# Patient Record
Sex: Female | Born: 1990 | Race: Black or African American | Hispanic: No | Marital: Single | Smoking: Never smoker
Health system: Southern US, Community
[De-identification: ages and names within clinical notes are randomized; demographics above are authoritative.]

## PROBLEM LIST (undated history)

## (undated) ENCOUNTER — Inpatient Hospital Stay (HOSPITAL_COMMUNITY): Payer: Self-pay

## (undated) DIAGNOSIS — F419 Anxiety disorder, unspecified: Secondary | ICD-10-CM

## (undated) DIAGNOSIS — F410 Panic disorder [episodic paroxysmal anxiety] without agoraphobia: Secondary | ICD-10-CM

## (undated) DIAGNOSIS — G43909 Migraine, unspecified, not intractable, without status migrainosus: Secondary | ICD-10-CM

---
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
## 2011-01-28 ENCOUNTER — Emergency Department (HOSPITAL_COMMUNITY)
Admission: EM | Admit: 2011-01-28 | Discharge: 2011-01-28 | Disposition: A | Discharge status: Home / Self Care | Payer: Medicaid Other | Attending: Emergency Medicine | Admitting: Emergency Medicine

## 2011-01-28 DIAGNOSIS — L299 Pruritus, unspecified: Secondary | ICD-10-CM | POA: Insufficient documentation

## 2011-01-28 DIAGNOSIS — B86 Scabies: Secondary | ICD-10-CM | POA: Insufficient documentation

## 2011-05-22 ENCOUNTER — Emergency Department (HOSPITAL_COMMUNITY)
Admission: EM | Admit: 2011-05-22 | Discharge: 2011-05-22 | Disposition: A | Discharge status: Home / Self Care | Payer: Self-pay | Attending: Emergency Medicine | Admitting: Emergency Medicine

## 2011-05-22 DIAGNOSIS — R109 Unspecified abdominal pain: Secondary | ICD-10-CM | POA: Insufficient documentation

## 2011-05-22 DIAGNOSIS — R3915 Urgency of urination: Secondary | ICD-10-CM | POA: Insufficient documentation

## 2011-05-22 DIAGNOSIS — R300 Dysuria: Secondary | ICD-10-CM | POA: Insufficient documentation

## 2011-05-22 DIAGNOSIS — N390 Urinary tract infection, site not specified: Secondary | ICD-10-CM | POA: Insufficient documentation

## 2011-05-22 DIAGNOSIS — R350 Frequency of micturition: Secondary | ICD-10-CM | POA: Insufficient documentation

## 2011-05-22 DIAGNOSIS — R319 Hematuria, unspecified: Secondary | ICD-10-CM | POA: Insufficient documentation

## 2011-05-22 LAB — URINALYSIS, ROUTINE W REFLEX MICROSCOPIC
Bilirubin Urine: NEGATIVE
Nitrite: NEGATIVE
Protein, ur: 100 mg/dL — AB
Specific Gravity, Urine: 1.023 (ref 1.005–1.030)
Urobilinogen, UA: 1 mg/dL (ref 0.0–1.0)

## 2011-05-22 LAB — PREGNANCY, URINE: Preg Test, Ur: NEGATIVE

## 2011-05-25 ENCOUNTER — Emergency Department (HOSPITAL_COMMUNITY)
Admission: EM | Admit: 2011-05-25 | Discharge: 2011-05-25 | Disposition: A | Discharge status: Home / Self Care | Payer: Self-pay | Attending: Emergency Medicine | Admitting: Emergency Medicine

## 2011-05-25 DIAGNOSIS — N390 Urinary tract infection, site not specified: Secondary | ICD-10-CM | POA: Insufficient documentation

## 2011-05-25 DIAGNOSIS — R300 Dysuria: Secondary | ICD-10-CM | POA: Insufficient documentation

## 2011-05-25 DIAGNOSIS — R3915 Urgency of urination: Secondary | ICD-10-CM | POA: Insufficient documentation

## 2011-05-25 DIAGNOSIS — R109 Unspecified abdominal pain: Secondary | ICD-10-CM | POA: Insufficient documentation

## 2011-05-25 LAB — URINALYSIS, ROUTINE W REFLEX MICROSCOPIC
Glucose, UA: NEGATIVE mg/dL
Nitrite: NEGATIVE
Specific Gravity, Urine: 1.027 (ref 1.005–1.030)
pH: 6 (ref 5.0–8.0)

## 2011-05-25 LAB — URINE MICROSCOPIC-ADD ON

## 2011-05-27 LAB — URINE CULTURE

## 2012-05-29 ENCOUNTER — Encounter (HOSPITAL_COMMUNITY): Payer: Self-pay | Admitting: *Deleted

## 2012-05-29 ENCOUNTER — Emergency Department (HOSPITAL_COMMUNITY)
Admission: EM | Admit: 2012-05-29 | Discharge: 2012-05-29 | Disposition: A | Discharge status: Home / Self Care | Payer: BC Managed Care – HMO | Attending: Emergency Medicine | Admitting: Emergency Medicine

## 2012-05-29 DIAGNOSIS — Z881 Allergy status to other antibiotic agents status: Secondary | ICD-10-CM | POA: Insufficient documentation

## 2012-05-29 DIAGNOSIS — N390 Urinary tract infection, site not specified: Secondary | ICD-10-CM | POA: Insufficient documentation

## 2012-05-29 DIAGNOSIS — F411 Generalized anxiety disorder: Secondary | ICD-10-CM | POA: Insufficient documentation

## 2012-05-29 HISTORY — DX: Anxiety disorder, unspecified: F41.9

## 2012-05-29 LAB — URINALYSIS, ROUTINE W REFLEX MICROSCOPIC
Ketones, ur: NEGATIVE mg/dL
Nitrite: POSITIVE — AB
Specific Gravity, Urine: 1.019 (ref 1.005–1.030)
pH: 6 (ref 5.0–8.0)

## 2012-05-29 LAB — URINE MICROSCOPIC-ADD ON

## 2012-05-29 MED ORDER — CIPROFLOXACIN HCL 500 MG PO TABS: 250.0000 mg | ORAL_TABLET | Freq: Two times a day (BID) | ORAL | Status: DC

## 2012-05-29 MED ORDER — PHENAZOPYRIDINE HCL 100 MG PO TABS
100.0000 mg | ORAL_TABLET | Freq: Once | ORAL | Status: AC
  Administered 2012-05-29: 100 mg via ORAL

## 2012-05-29 MED ORDER — PHENAZOPYRIDINE HCL 200 MG PO TABS: 200.0000 mg | ORAL_TABLET | Freq: Three times a day (TID) | ORAL | Status: DC

## 2012-05-29 MED ORDER — CIPROFLOXACIN HCL 500 MG PO TABS
500.0000 mg | ORAL_TABLET | Freq: Once | ORAL | Status: AC
  Administered 2012-05-29: 500 mg via ORAL
  Filled 2012-05-29: qty 1

## 2012-05-29 NOTE — ED Notes (Signed)
Pt is here for evaluation of what pt feels is a UTI.  She reports pain with urination beginning yesterday and blood in her urine beginning today.  Pt has had same before

## 2012-05-29 NOTE — ED Provider Notes (Signed)
History     CSN: [id number]  Arrival date & time 05/29/12  4098   First MD Initiated Contact with Patient 05/29/12 2109      Chief Complaint  Patient presents with  . Urinary Tract Infection    (Consider location/radiation/quality/duration/timing/severity/associated sxs/prior treatment) HPI Comments: Patient is a healthy 21 year old female with a 2 day history of dysuria and hematuria. She reports progressive onset of her symptoms 2 days ago. The symptoms occur every time she urinates. She reports this same feeling when she had a UTI 1 year ago. She did not try anything for symptom relief and denies alleviating factors. She denies fever, NVD, back pain, abdominal pain, vaginal bleeding, menstrual abnormalities.    Past Medical History  Diagnosis Date  . Anxiety     History reviewed. No pertinent past surgical history.  No family history on file.  History  Substance Use Topics  . Smoking status: Not on file  . Smokeless tobacco: Not on file  . Alcohol Use: Yes    OB History    Grav Para Term Preterm Abortions TAB SAB Ect Mult Living                  Review of Systems  Genitourinary: Positive for dysuria and hematuria.  All other systems reviewed and are negative.    Allergies  Amoxicillin  Home Medications   Current Outpatient Rx  Name Route Sig Dispense Refill  . IBUPROFEN 200 MG PO TABS Oral Take 800 mg by mouth every 6 (six) hours as needed. For pain    . CIPROFLOXACIN HCL 500 MG PO TABS Oral Take 0.5 tablets (250 mg total) by mouth 2 (two) times daily. 6 tablet 0  . PHENAZOPYRIDINE HCL 200 MG PO TABS Oral Take 1 tablet (200 mg total) by mouth 3 (three) times daily. 6 tablet 0    BP 114/71  Pulse 66  Temp 97.8 F (36.6 C) (Oral)  Resp 16  SpO2 99%  LMP 05/15/2012  Physical Exam  Nursing note and vitals reviewed. Constitutional: She is oriented to person, place, and time. She appears well-developed and well-nourished. No distress.  HENT:  Head:  Normocephalic and atraumatic.  Eyes: Conjunctivae normal and EOM are normal. No scleral icterus.  Neck: Normal range of motion. Neck supple.  Cardiovascular: Normal rate and regular rhythm.  Exam reveals no gallop and no friction rub.   No murmur heard. Pulmonary/Chest: Effort normal and breath sounds normal. She has no wheezes. She has no rales. She exhibits no tenderness.  Abdominal: Soft. There is no tenderness.       No suprapubic tenderness to palpation.   Musculoskeletal: Normal range of motion.  Neurological: She is alert and oriented to person, place, and time. Coordination normal.       Speech is goal-oriented. Moves limbs without ataxia.   Skin: Skin is warm and dry.  Psychiatric: She has a normal mood and affect. Her behavior is normal.    ED Course  Procedures (including critical care time)  Labs Reviewed  URINALYSIS, ROUTINE W REFLEX MICROSCOPIC - Abnormal; Notable for the following:    APPearance CLOUDY (*)     Hgb urine dipstick LARGE (*)     Protein, ur 30 (*)     Nitrite POSITIVE (*)     Leukocytes, UA LARGE (*)     All other components within normal limits  URINE MICROSCOPIC-ADD ON - Abnormal; Notable for the following:    Squamous Epithelial / LPF FEW (*)  Bacteria, UA MANY (*)     All other components within normal limits  POCT PREGNANCY, URINE   No results found.   1. UTI (urinary tract infection)       MDM  9:31 PM Patient's urinalysis shows a UTI. I will treat her with Cipro rather than bactrim because Bactrim has not worked for her with previous UTIs. I will also prescribe her pyridium for her dysuria. She is afebrile with no other complaints. No further evaluation needed at this time.         Emilia Beck, PA-C 05/29/12 2142

## 2012-05-29 NOTE — ED Provider Notes (Signed)
Medical screening examination/treatment/procedure(s) were performed by non-physician practitioner and as supervising physician I was immediately available for consultation/collaboration.   Charles B. Sheldon, MD 05/29/12 2246 

## 2012-06-01 LAB — URINE CULTURE: Colony Count: >=100000

## 2012-06-02 NOTE — ED Notes (Signed)
+  Urine. Patient treated with Cipro. Sensitive to same. Per protocol MD. °

## 2012-10-17 ENCOUNTER — Emergency Department (HOSPITAL_COMMUNITY): Payer: BC Managed Care – PPO

## 2012-10-17 ENCOUNTER — Encounter (HOSPITAL_COMMUNITY): Payer: Self-pay | Admitting: Emergency Medicine

## 2012-10-17 ENCOUNTER — Emergency Department (HOSPITAL_COMMUNITY)
Admission: EM | Admit: 2012-10-17 | Discharge: 2012-10-18 | Disposition: A | Discharge status: Home / Self Care | Payer: BC Managed Care – PPO | Attending: Emergency Medicine | Admitting: Emergency Medicine

## 2012-10-17 DIAGNOSIS — R042 Hemoptysis: Secondary | ICD-10-CM | POA: Insufficient documentation

## 2012-10-17 DIAGNOSIS — F411 Generalized anxiety disorder: Secondary | ICD-10-CM | POA: Insufficient documentation

## 2012-10-17 DIAGNOSIS — Z79899 Other long term (current) drug therapy: Secondary | ICD-10-CM | POA: Insufficient documentation

## 2012-10-17 DIAGNOSIS — R059 Cough, unspecified: Secondary | ICD-10-CM | POA: Insufficient documentation

## 2012-10-17 DIAGNOSIS — J3489 Other specified disorders of nose and nasal sinuses: Secondary | ICD-10-CM | POA: Insufficient documentation

## 2012-10-17 HISTORY — DX: Panic disorder (episodic paroxysmal anxiety): F41.0

## 2012-10-17 LAB — POCT I-STAT, CHEM 8
Calcium, Ion: 1.26 mmol/L — ABNORMAL HIGH (ref 1.12–1.23)
Glucose, Bld: 89 mg/dL (ref 70–99)
HCT: 40 % (ref 36.0–46.0)
Hemoglobin: 13.6 g/dL (ref 12.0–15.0)
Potassium: 4.2 mEq/L (ref 3.5–5.1)

## 2012-10-17 NOTE — ED Notes (Signed)
Patient complaining of being sick since the end of December; has been treating herself with Mucinex and other OTC medications; patient reports that she was coughing up blood about 20 minutes ago; patient also complaining of dizziness, and a productive cough.  Cough sounds congested.

## 2012-10-18 MED ORDER — BENZONATATE 100 MG PO CAPS: 100.0000 mg | ORAL_CAPSULE | Freq: Three times a day (TID) | ORAL | Status: DC

## 2012-10-18 MED ORDER — AZITHROMYCIN 250 MG PO TABS: 250.0000 mg | ORAL_TABLET | Freq: Every day | ORAL | Status: DC

## 2012-10-18 NOTE — ED Notes (Signed)
The patient is AOx4 and comfortable with her discharge instructions. 

## 2012-10-18 NOTE — ED Provider Notes (Signed)
History     CSN: [id number]  Arrival date & time 10/17/12  2014   First MD Initiated Contact with Patient 10/18/12 0004      Chief Complaint  Patient presents with  . Cough  . Emesis    (Consider location/radiation/quality/duration/timing/severity/associated sxs/prior treatment) HPI History provided by patient. Past medical history of anxiety and panic. Has been sick on off for last few months with a persistent dry cough.  Recently she has had some associated congestion. Tonight coughing and noticed some specks of what she thought was blood. The patient very worried and presents here for evaluation. No difficulty breathing. No history of DVT or PE. Is not on birth control.  No leg pain or leg swelling. Works at [employer] and has multiple sick contacts with similar symptoms. Hemoptysis mild in severity. No chest pain. No fevers. No hemoptysis in the emergency department.  Past Medical History  Diagnosis Date  . Anxiety   . Panic attack     History reviewed. No pertinent past surgical history.  History reviewed. No pertinent family history.  History  Substance Use Topics  . Smoking status: Never Smoker   . Smokeless tobacco: Not on file  . Alcohol Use: Yes    OB History   Grav Para Term Preterm Abortions TAB SAB Ect Mult Living                  Review of Systems  Constitutional: Negative for fever and chills.  HENT: Positive for congestion. Negative for neck pain and neck stiffness.   Eyes: Negative for pain.  Respiratory: Positive for cough. Negative for shortness of breath.   Cardiovascular: Negative for chest pain.  Gastrointestinal: Negative for vomiting and abdominal pain.  Genitourinary: Negative for dysuria.  Musculoskeletal: Negative for back pain.  Skin: Negative for rash.  Neurological: Negative for headaches.  All other systems reviewed and are negative.    Allergies  Amoxicillin  Home Medications   Current Outpatient Rx  Name  Route  Sig   Dispense  Refill  . azithromycin (ZITHROMAX Z-PAK) 250 MG tablet   Oral   Take 1 tablet (250 mg total) by mouth daily.   6 tablet   0   . benzonatate (TESSALON) 100 MG capsule   Oral   Take 1 capsule (100 mg total) by mouth every 8 (eight) hours.   21 capsule   0   . ciprofloxacin (CIPRO) 500 MG tablet   Oral   Take 0.5 tablets (250 mg total) by mouth 2 (two) times daily.   6 tablet   0   . ibuprofen (ADVIL,MOTRIN) 200 MG tablet   Oral   Take 800 mg by mouth every 6 (six) hours as needed. For pain         . phenazopyridine (PYRIDIUM) 200 MG tablet   Oral   Take 1 tablet (200 mg total) by mouth 3 (three) times daily.   6 tablet   0     BP 101/50  Pulse 86  Temp(Src) 98.2 F (36.8 C) (Oral)  Resp 18  SpO2 98%  LMP 10/17/2012  Physical Exam  Constitutional: She is oriented to person, place, and time. She appears well-developed and well-nourished.  HENT:  Head: Normocephalic and atraumatic.  Mouth/Throat: Oropharynx is clear and moist. No oropharyngeal exudate.  Nasal congestion. No tenderness over sinuses  Eyes: Conjunctivae and EOM are normal. Pupils are equal, round, and reactive to light.  Neck: Normal range of motion. Neck supple.  Cardiovascular:  Normal rate, regular rhythm and intact distal pulses.   Pulmonary/Chest: Effort normal and breath sounds normal. No stridor. No respiratory distress. She exhibits no tenderness.  Abdominal: Soft. Bowel sounds are normal. She exhibits no distension. There is no tenderness.  Musculoskeletal: Normal range of motion. She exhibits no edema.  Lymphadenopathy:    She has no cervical adenopathy.  Neurological: She is alert and oriented to person, place, and time.  Skin: Skin is warm and dry.    ED Course  Procedures (including critical care time)  Labs Reviewed  POCT I-STAT, CHEM 8 - Abnormal; Notable for the following:    Calcium, Ion 1.26 (*)    All other components within normal limits   Dg Chest 2  View  10/17/2012  *RADIOLOGY REPORT*  Clinical Data: Coughing up blood.  The patient  has been sick since December.  CHEST - 2 VIEW  Comparison: None.  Findings: The heart size and pulmonary vascularity are normal. The lungs appear clear and expanded without focal air space disease or consolidation. No blunting of the costophrenic angles.  No pneumothorax.  Mediastinal contours appear intact.  IMPRESSION: No evidence of active pulmonary disease.   Original Report Authenticated By: Burman Nieves, M.D.      1. Cough with hemoptysis       MDM   Congestion and persistent dry cough - likely sequela of viral URI.  Mild hemoptysis today. No indication for PE workup at this time given mild symptoms resolved, vital signs within normal limits and PERC negative.  Chest x-ray labs reviewed as above  Prescription for cough medication a Z-Pak provided.   Hemoptysis precautions verbalized as understood.   Vital signs nursing notes reviewed        Sunnie Nielsen, MD 10/18/12 [phone]

## 2013-03-15 ENCOUNTER — Emergency Department (HOSPITAL_COMMUNITY)
Admission: EM | Admit: 2013-03-15 | Discharge: 2013-03-15 | Disposition: A | Discharge status: Home / Self Care | Payer: BC Managed Care – PPO | Attending: Emergency Medicine | Admitting: Emergency Medicine

## 2013-03-15 ENCOUNTER — Emergency Department (HOSPITAL_COMMUNITY): Payer: BC Managed Care – PPO

## 2013-03-15 ENCOUNTER — Encounter (HOSPITAL_COMMUNITY): Payer: Self-pay | Admitting: Adult Health

## 2013-03-15 DIAGNOSIS — F411 Generalized anxiety disorder: Secondary | ICD-10-CM | POA: Insufficient documentation

## 2013-03-15 DIAGNOSIS — R042 Hemoptysis: Secondary | ICD-10-CM | POA: Insufficient documentation

## 2013-03-15 DIAGNOSIS — R071 Chest pain on breathing: Secondary | ICD-10-CM | POA: Insufficient documentation

## 2013-03-15 DIAGNOSIS — R05 Cough: Secondary | ICD-10-CM | POA: Insufficient documentation

## 2013-03-15 DIAGNOSIS — R059 Cough, unspecified: Secondary | ICD-10-CM | POA: Insufficient documentation

## 2013-03-15 LAB — CBC WITH DIFFERENTIAL/PLATELET
Eosinophils Relative: 3 % (ref 0–5)
HCT: 34.4 % — ABNORMAL LOW (ref 36.0–46.0)
Hemoglobin: 11.4 g/dL — ABNORMAL LOW (ref 12.0–15.0)
Lymphocytes Relative: 39 % (ref 12–46)
Lymphs Abs: 2.1 10*3/uL (ref 0.7–4.0)
MCV: 85.6 fL (ref 78.0–100.0)
Monocytes Absolute: 0.3 10*3/uL (ref 0.1–1.0)
RBC: 4.02 MIL/uL (ref 3.87–5.11)
WBC: 5.3 10*3/uL (ref 4.0–10.5)

## 2013-03-15 NOTE — ED Provider Notes (Signed)
CSN: [id number]     Arrival date & time 03/15/13  0241 History     First MD Initiated Contact with Patient 03/15/13 0320     Chief Complaint  Patient presents with  . Hemoptysis   (Consider location/radiation/quality/duration/timing/severity/associated sxs/prior Treatment) HPI 22 yo female presents to the ER from home with c/o 2 episodes of coughing blood.  First episode productive of "chunk" of tsp of blood, second episode blood streaked sputum.  No recent URI, sinus infection, nose bleed.  No further coughing.  Some central dull chest pain.  No fevers, chills.  No exposure to TB, no night sweats, no significant weight loss.  Pt had similar episode about 5 months ago, but that one had more coughing/URI sxs.  No sob.  No exogenous hormones, no leg swelling.  Pt works at [employer].  No h/o PE, blood disorders.  Pt smokes one "black and tan" a day  Past Medical History  Diagnosis Date  . Anxiety   . Panic attack    History reviewed. No pertinent past surgical history. History reviewed. No pertinent family history. History  Substance Use Topics  . Smoking status: Never Smoker   . Smokeless tobacco: Not on file  . Alcohol Use: Yes   OB History   Grav Para Term Preterm Abortions TAB SAB Ect Mult Living                 Review of Systems  All other systems reviewed and are negative.    Allergies  Amoxicillin  Home Medications   Current Outpatient Rx  Name  Route  Sig  Dispense  Refill  . ibuprofen (ADVIL,MOTRIN) 800 MG tablet   Oral   Take 800 mg by mouth every 8 (eight) hours as needed for pain.          BP 112/68  Pulse 58  Temp(Src) 98.4 F (36.9 C) (Oral)  Resp 16  SpO2 100%  LMP 03/15/2013 Physical Exam  Nursing note and vitals reviewed. Constitutional: She is oriented to person, place, and time. She appears well-developed and well-nourished.  HENT:  Head: Normocephalic and atraumatic.  Right Ear: External ear normal.  Left Ear: External ear  normal.  Nose: Nose normal.  Mouth/Throat: Oropharynx is clear and moist.  Eyes: Conjunctivae and EOM are normal. Pupils are equal, round, and reactive to light.  Neck: Normal range of motion. Neck supple. No JVD present. No tracheal deviation present. No thyromegaly present.  Cardiovascular: Normal rate, regular rhythm, normal heart sounds and intact distal pulses.  Exam reveals no gallop and no friction rub.   No murmur heard. Pulmonary/Chest: Effort normal and breath sounds normal. No stridor. No respiratory distress. She has no wheezes. She has no rales. She exhibits no tenderness.  Abdominal: Soft. Bowel sounds are normal. She exhibits no distension and no mass. There is no tenderness. There is no rebound and no guarding.  Musculoskeletal: Normal range of motion. She exhibits no edema and no tenderness.  Lymphadenopathy:    She has no cervical adenopathy.  Neurological: She is alert and oriented to person, place, and time. She exhibits normal muscle tone. Coordination normal.  Skin: Skin is warm and dry. No rash noted. No erythema. No pallor.  Psychiatric: She has a normal mood and affect. Her behavior is normal. Judgment and thought content normal.    ED Course   Procedures (including critical care time)  Labs Reviewed  CBC WITH DIFFERENTIAL - Abnormal; Notable for the following:  Hemoglobin 11.4 (*)    HCT 34.4 (*)    All other components within normal limits  QUANTIFERON TB GOLD ASSAY   Dg Chest 2 View  03/15/2013   *RADIOLOGY REPORT*  Clinical Data: Chest pain  CHEST - 2 VIEW  Comparison: Prior radiograph from 10/17/2012  Findings: The cardiac and mediastinal silhouettes are stable in size and contour, and remain within normal limits.  The lungs are well inflated.  No airspace consolidation, pleural effusion, or pulmonary edema is identified.  There is no pneumothorax.  Osseous structures are within normal limits.  Partial fusion of the right fourth and fifth ribs is noted,  unchanged.  IMPRESSION:  No active cardiopulmonary disease.   Original Report Authenticated By: Rise Mu, M.D.   1. Cough with hemoptysis     MDM  22 yo female with hemoptysis.  Normal cxr, normal exam.  No further coughing.  Will get quantiferon assay to be followed up outpatient.  Will refer to local [facility] and pulmonology.  Olivia Mackie, MD 03/15/13 [phone]

## 2013-03-15 NOTE — ED Notes (Addendum)
Presents with cough and blood tinged sputum that began this evening associated with SOB and chest pain. Pain is with cough.  Seen here one month ago for same.

## 2013-04-30 ENCOUNTER — Encounter (HOSPITAL_COMMUNITY): Payer: Self-pay | Admitting: Emergency Medicine

## 2013-04-30 ENCOUNTER — Emergency Department (HOSPITAL_COMMUNITY)
Admission: EM | Admit: 2013-04-30 | Discharge: 2013-04-30 | Disposition: A | Discharge status: Home / Self Care | Payer: Self-pay | Source: Home / Self Care

## 2013-04-30 DIAGNOSIS — J029 Acute pharyngitis, unspecified: Secondary | ICD-10-CM

## 2013-04-30 MED ORDER — AZITHROMYCIN 250 MG PO TABS: ORAL_TABLET | ORAL | Status: DC

## 2013-04-30 NOTE — ED Notes (Signed)
Pt c/o sore throat onset 2 days Sxs include: odynophagia, chills, BA, fevers, Denies: cough, runny nose, congestion Alert w/no signs of acute distress.

## 2013-04-30 NOTE — ED Provider Notes (Signed)
CSN: [id number]     Arrival date & time 04/30/13  1037 History   First MD Initiated Contact with Patient 04/30/13 1058     Chief Complaint  Patient presents with  . Sore Throat   (Consider location/radiation/quality/duration/timing/severity/associated sxs/prior Treatment) HPI Comments: Sore throat for 2 d. Odynophagia, bodyaches, pain with opening jaw. Reports temp 102 deg last PM   Past Medical History  Diagnosis Date  . Anxiety   . Panic attack    History reviewed. No pertinent past surgical history. No family history on file. History  Substance Use Topics  . Smoking status: Never Smoker   . Smokeless tobacco: Not on file  . Alcohol Use: Yes   OB History   Grav Para Term Preterm Abortions TAB SAB Ect Mult Living                 Review of Systems  Constitutional: Positive for fever, activity change and fatigue.  HENT: Positive for sore throat. Negative for ear pain, congestion, facial swelling, rhinorrhea, neck pain, neck stiffness, postnasal drip, sinus pressure and ear discharge.   Respiratory: Negative for cough and shortness of breath.   Cardiovascular: Negative.   Gastrointestinal: Negative.   Genitourinary: Negative.   Musculoskeletal: Positive for myalgias.  Neurological: Negative.     Allergies  Amoxicillin  Home Medications   Current Outpatient Rx  Name  Route  Sig  Dispense  Refill  . azithromycin (ZITHROMAX) 250 MG tablet      2 tabs daily for 5 days.   10 tablet   0   . ibuprofen (ADVIL,MOTRIN) 800 MG tablet   Oral   Take 800 mg by mouth every 8 (eight) hours as needed for pain.          BP 98/60  Pulse 83  Temp(Src) 98.4 F (36.9 C) (Oral)  Resp 16  SpO2 98%  LMP 04/09/2013 Physical Exam  Nursing note and vitals reviewed. Constitutional: She is oriented to person, place, and time. She appears well-developed and well-nourished. No distress.  HENT:  Bilat TM's WNL OP with erythema, mild swelling and tonsilar exudates. No sign of  retropharnygeal  Eyes: Conjunctivae and EOM are normal. Pupils are equal, round, and reactive to light.  Neck: Normal range of motion. Neck supple.  Cardiovascular: Normal rate, regular rhythm and normal heart sounds.   Pulmonary/Chest: Effort normal and breath sounds normal. No respiratory distress. She has no wheezes.  Musculoskeletal: Normal range of motion.  Lymphadenopathy:    She has no cervical adenopathy.  Neurological: She is alert and oriented to person, place, and time. No cranial nerve deficit. She exhibits normal muscle tone.  Skin: Skin is warm and dry. No rash noted. No erythema.  Psychiatric: She has a normal mood and affect.    ED Course  Procedures (including critical care time) Labs Review Labs Reviewed  POCT RAPID STREP A ([facility] ONLY)  POCT RAPID STREP A ([facility] ONLY)   Results for orders placed during the hospital encounter of 04/30/13  POCT RAPID STREP A ([facility] ONLY)      Result Value Range   Streptococcus, Group A Screen (Direct) NEGATIVE  NEGATIVE  POCT RAPID STREP A ([facility] ONLY)      Result Value Range   Streptococcus, Group A Screen (Direct) NEGATIVE  NEGATIVE    Imaging Review No results found.  MDM   1. Exudative pharyngitis      Rapid strep test is negative. However, I  am inclined to treat her with antibiotics due to the tonsillar swelling, pharyngeal erythema and exudates. She has also had a reported temporal 102 at home she is having bodyaches fatigue and malaise. She is allergic to penicillin. Azithromycin 500 mg daily for 5 days. Rest, plenty of fluids, ibuprofen 600 mg every 6 hours with food when necessary pain. Chloraseptic Spray when necessary; Cepacol lozenges needed for sore throat discomfort.     Hayden Rasmussen, NP 04/30/13 1126

## 2013-04-30 NOTE — ED Provider Notes (Signed)
Medical screening examination/treatment/procedure(s) were performed by non-physician practitioner and as supervising physician I was immediately available for consultation/collaboration.  Leslee Home, M.D.   Reuben Likes, MD 04/30/13 1535

## 2013-05-02 LAB — CULTURE, GROUP A STREP

## 2015-04-13 ENCOUNTER — Encounter (HOSPITAL_COMMUNITY): Payer: Self-pay | Admitting: Emergency Medicine

## 2015-04-13 ENCOUNTER — Emergency Department (HOSPITAL_COMMUNITY)
Admission: EM | Admit: 2015-04-13 | Discharge: 2015-04-13 | Disposition: A | Discharge status: Home / Self Care | Payer: Self-pay | Attending: Emergency Medicine | Admitting: Emergency Medicine

## 2015-04-13 DIAGNOSIS — Z792 Long term (current) use of antibiotics: Secondary | ICD-10-CM | POA: Insufficient documentation

## 2015-04-13 DIAGNOSIS — S3992XA Unspecified injury of lower back, initial encounter: Secondary | ICD-10-CM | POA: Insufficient documentation

## 2015-04-13 DIAGNOSIS — Y92410 Unspecified street and highway as the place of occurrence of the external cause: Secondary | ICD-10-CM | POA: Insufficient documentation

## 2015-04-13 DIAGNOSIS — M545 Low back pain, unspecified: Secondary | ICD-10-CM

## 2015-04-13 DIAGNOSIS — Z8659 Personal history of other mental and behavioral disorders: Secondary | ICD-10-CM | POA: Insufficient documentation

## 2015-04-13 DIAGNOSIS — Z880 Allergy status to penicillin: Secondary | ICD-10-CM | POA: Insufficient documentation

## 2015-04-13 DIAGNOSIS — Y9389 Activity, other specified: Secondary | ICD-10-CM | POA: Insufficient documentation

## 2015-04-13 DIAGNOSIS — Y998 Other external cause status: Secondary | ICD-10-CM | POA: Insufficient documentation

## 2015-04-13 MED ORDER — KETOROLAC TROMETHAMINE 60 MG/2ML IM SOLN
60.0000 mg | Freq: Once | INTRAMUSCULAR | Status: AC
  Administered 2015-04-13: 60 mg via INTRAMUSCULAR
  Filled 2015-04-13: qty 2

## 2015-04-13 MED ORDER — METHOCARBAMOL 500 MG PO TABS: 500.0000 mg | ORAL_TABLET | Freq: Two times a day (BID) | ORAL | Status: DC

## 2015-04-13 MED ORDER — NAPROXEN 500 MG PO TABS: 500.0000 mg | ORAL_TABLET | Freq: Two times a day (BID) | ORAL | Status: DC
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
## 2015-04-13 NOTE — ED Notes (Signed)
Declined W/C at D/C and was escorted to lobby by RN. 

## 2016-03-28 ENCOUNTER — Encounter (HOSPITAL_COMMUNITY): Payer: Self-pay | Admitting: Emergency Medicine

## 2016-03-28 ENCOUNTER — Emergency Department (HOSPITAL_COMMUNITY)
Admission: EM | Admit: 2016-03-28 | Discharge: 2016-03-28 | Disposition: A | Discharge status: Home / Self Care | Payer: Self-pay | Attending: Emergency Medicine | Admitting: Emergency Medicine

## 2016-03-28 DIAGNOSIS — N760 Acute vaginitis: Secondary | ICD-10-CM | POA: Insufficient documentation

## 2016-03-28 DIAGNOSIS — B9689 Other specified bacterial agents as the cause of diseases classified elsewhere: Secondary | ICD-10-CM | POA: Insufficient documentation

## 2016-03-28 DIAGNOSIS — N390 Urinary tract infection, site not specified: Secondary | ICD-10-CM | POA: Insufficient documentation

## 2016-03-28 LAB — URINALYSIS, ROUTINE W REFLEX MICROSCOPIC
GLUCOSE, UA: NEGATIVE mg/dL
HGB URINE DIPSTICK: NEGATIVE
KETONES UR: 15 mg/dL — AB
NITRITE: POSITIVE — AB
PH: 6 (ref 5.0–8.0)
Protein, ur: NEGATIVE mg/dL
SPECIFIC GRAVITY, URINE: 1.03 (ref 1.005–1.030)

## 2016-03-28 LAB — URINE MICROSCOPIC-ADD ON

## 2016-03-28 LAB — WET PREP, GENITAL
SPERM: NONE SEEN
Trich, Wet Prep: NONE SEEN
Yeast Wet Prep HPF POC: NONE SEEN

## 2016-03-28 LAB — I-STAT BETA HCG BLOOD, ED (MC, WL, AP ONLY)

## 2016-03-28 MED ORDER — CIPROFLOXACIN HCL 500 MG PO TABS: 500.0000 mg | ORAL_TABLET | Freq: Two times a day (BID) | ORAL | 0 refills | Status: DC

## 2016-03-28 MED ORDER — METRONIDAZOLE 500 MG PO TABS: 500.0000 mg | ORAL_TABLET | Freq: Two times a day (BID) | ORAL | 0 refills | Status: DC

## 2016-03-28 NOTE — ED Notes (Signed)
Pelvic cart at bedside. 

## 2016-03-28 NOTE — ED Provider Notes (Signed)
[facility] DEPT Provider Note   CSN: [id number] Arrival date & time: 03/28/16  [id number]  First Provider Contact:  First MD Initiated Contact with Patient 03/28/16 [phone]        History   Chief Complaint Chief Complaint  Patient presents with  . Vaginal Discharge  . Dysuria    HPI April Riddle is a 25 y.o. female.  Patient presents with dysuria. She's had 3 day history of urinary frequency and burning on urination. She has some discomfort in her suprapubic area. No nausea or vomiting. No known fevers. She has been feeling fatigued. She also has developed some sores in her vaginal area and has some vaginal discharge. She is sexually active. She has taken Azo over-the-counter without improvement in symptoms.      Past Medical History:  Diagnosis Date  . Anxiety   . Panic attack     Patient Active Problem List   Diagnosis Date Noted  . Panic attack     History reviewed. No pertinent surgical history.  OB History    Gravida Para Term Preterm AB Living   0 0 0 0 0 0   SAB TAB Ectopic Multiple Live Births   0 0 0 0 0       Home Medications    Prior to Admission medications   Medication Sig Start Date End Date Taking? Authorizing Provider  acetaminophen (TYLENOL) 500 MG tablet Take 1,000 mg by mouth every 6 (six) hours as needed for moderate pain or headache.   Yes Historical Provider, MD  phenazopyridine (AZO-STANDARD) 95 MG tablet Take 95 mg by mouth 3 (three) times daily as needed for pain.   Yes Historical Provider, MD  azithromycin (ZITHROMAX) 250 MG tablet 2 tabs daily for 5 days. Patient not taking: Reported on 03/28/2016 04/30/13   Janne Napoleon, NP  ciprofloxacin (CIPRO) 500 MG tablet Take 1 tablet (500 mg total) by mouth 2 (two) times daily. One po bid x 7 days 03/28/16   Malvin Johns, MD  ibuprofen (ADVIL,MOTRIN) 800 MG tablet Take 800 mg by mouth every 8 (eight) hours as needed for pain.    Historical Provider, MD  methocarbamol (ROBAXIN) 500 MG tablet Take 1  tablet (500 mg total) by mouth 2 (two) times daily. Patient not taking: Reported on 03/28/2016 04/13/15   Comer Locket, PA-C  metroNIDAZOLE (FLAGYL) 500 MG tablet Take 1 tablet (500 mg total) by mouth 2 (two) times daily. One po bid x 7 days 03/28/16   Malvin Johns, MD  naproxen (NAPROSYN) 500 MG tablet Take 1 tablet (500 mg total) by mouth 2 (two) times daily. Patient not taking: Reported on 03/28/2016 04/13/15   Comer Locket, PA-C    Family History No family history on file.  Social History Social History  Substance Use Topics  . Smoking status: Never Smoker  . Smokeless tobacco: Never Used  . Alcohol use No     Allergies   Amoxicillin   Review of Systems Review of Systems  Constitutional: Negative for chills, diaphoresis, fatigue and fever.  HENT: Negative for congestion, rhinorrhea and sneezing.   Eyes: Negative.   Respiratory: Negative for cough, chest tightness and shortness of breath.   Cardiovascular: Negative for chest pain and leg swelling.  Gastrointestinal: Positive for abdominal pain. Negative for blood in stool, diarrhea, nausea and vomiting.  Genitourinary: Positive for dysuria, frequency, hematuria and vaginal discharge. Negative for difficulty urinating, flank pain, vaginal bleeding and vaginal pain.  Musculoskeletal: Negative for arthralgias and back pain.  Skin:  Negative for rash.  Neurological: Negative for dizziness, speech difficulty, weakness, numbness and headaches.     Physical Exam Updated Vital Signs BP 104/66 (BP Location: Right Arm)   Pulse 74   Temp 98.4 F (36.9 C) (Oral)   Resp 15   Ht 5\' 8"  (1.727 m)   Wt 155 lb (70.3 kg)   LMP 03/17/2016 (Approximate)   SpO2 100%   BMI 23.57 kg/m   Physical Exam  Constitutional: She is oriented to person, place, and time. She appears well-developed and well-nourished.  HENT:  Head: Normocephalic and atraumatic.  Eyes: Pupils are equal, round, and reactive to light.  Neck: Normal range of  motion. Neck supple.  Cardiovascular: Normal rate, regular rhythm and normal heart sounds.   Pulmonary/Chest: Effort normal and breath sounds normal. No respiratory distress. She has no wheezes. She has no rales. She exhibits no tenderness.  Abdominal: Soft. Bowel sounds are normal. There is tenderness (Mild suprapubic tenderness). There is no rebound and no guarding.  Genitourinary:  Genitourinary Comments: Patient has large amount of white yellow discharge. No significant cervical motion tenderness or adnexal tenderness.  No blisters or sores.  Musculoskeletal: Normal range of motion. She exhibits no edema.  Lymphadenopathy:    She has no cervical adenopathy.  Neurological: She is alert and oriented to person, place, and time.  Skin: Skin is warm and dry. No rash noted.  Psychiatric: She has a normal mood and affect.     ED Treatments / Results  Labs (all labs ordered are listed, but only abnormal results are displayed) Labs Reviewed  WET PREP, GENITAL - Abnormal; Notable for the following:       Result Value   Clue Cells Wet Prep HPF POC PRESENT (*)    WBC, Wet Prep HPF POC MANY (*)    All other components within normal limits  URINALYSIS, ROUTINE W REFLEX MICROSCOPIC (NOT AT [facility]) - Abnormal; Notable for the following:    Color, Urine ORANGE (*)    APPearance CLOUDY (*)    Bilirubin Urine SMALL (*)    Ketones, ur 15 (*)    Nitrite POSITIVE (*)    Leukocytes, UA SMALL (*)    All other components within normal limits  URINE MICROSCOPIC-ADD ON - Abnormal; Notable for the following:    Squamous Epithelial / LPF 6-30 (*)    Bacteria, UA MANY (*)    All other components within normal limits  RPR  HIV ANTIBODY (ROUTINE TESTING)  I-STAT BETA HCG BLOOD, ED ([facility], [facility], [facility] ONLY)  GC/CHLAMYDIA PROBE AMP ([place]) NOT AT [facility]    EKG  EKG Interpretation None       Radiology No results found.  Procedures Procedures (including critical care time)  Medications Ordered in  ED Medications - No data to display   Initial Impression / Assessment and Plan / ED Course  I have reviewed the triage vital signs and the nursing notes.  Pertinent labs & imaging results that were available during my care of the patient were reviewed by me and considered in my medical decision making (see chart for details).  Clinical Course    Patient's urinalysis is positive for infection. Her symptoms are consistent with a UTI. She also has evidence of bacterial vaginosis. She was discharged with a prescription for Flagyl and Cipro. She was given referral to follow-up with the women's outpatient clinic if her symptoms are not improving or return here as needed for any worsening symptoms.  Final Clinical Impressions(s) /  ED Diagnoses   Final diagnoses:  UTI (lower urinary tract infection)  BV (bacterial vaginosis)    New Prescriptions New Prescriptions   CIPROFLOXACIN (CIPRO) 500 MG TABLET    Take 1 tablet (500 mg total) by mouth 2 (two) times daily. One po bid x 7 days   METRONIDAZOLE (FLAGYL) 500 MG TABLET    Take 1 tablet (500 mg total) by mouth 2 (two) times daily. One po bid x 7 days     Malvin Johns, MD 03/28/16 1139

## 2016-03-28 NOTE — ED Triage Notes (Signed)
Pt c/o having trouble urinating and sores being in the vaginal area. Been going on for about 3 days.

## 2016-03-29 ENCOUNTER — Encounter (HOSPITAL_COMMUNITY): Payer: Self-pay | Admitting: Emergency Medicine

## 2016-03-29 ENCOUNTER — Emergency Department (HOSPITAL_COMMUNITY)
Admission: EM | Admit: 2016-03-29 | Discharge: 2016-03-29 | Disposition: A | Discharge status: Home / Self Care | Payer: Self-pay | Attending: Emergency Medicine | Admitting: Emergency Medicine

## 2016-03-29 DIAGNOSIS — E860 Dehydration: Secondary | ICD-10-CM | POA: Insufficient documentation

## 2016-03-29 DIAGNOSIS — N390 Urinary tract infection, site not specified: Secondary | ICD-10-CM | POA: Insufficient documentation

## 2016-03-29 DIAGNOSIS — R55 Syncope and collapse: Secondary | ICD-10-CM | POA: Insufficient documentation

## 2016-03-29 DIAGNOSIS — Z79899 Other long term (current) drug therapy: Secondary | ICD-10-CM | POA: Insufficient documentation

## 2016-03-29 LAB — COMPREHENSIVE METABOLIC PANEL
ALBUMIN: 4 g/dL (ref 3.5–5.0)
ALT: 11 U/L — ABNORMAL LOW (ref 14–54)
AST: 17 U/L (ref 15–41)
Alkaline Phosphatase: 61 U/L (ref 38–126)
Anion gap: 9 (ref 5–15)
BILIRUBIN TOTAL: 0.8 mg/dL (ref 0.3–1.2)
BUN: 7 mg/dL (ref 6–20)
CHLORIDE: 101 mmol/L (ref 101–111)
CO2: 24 mmol/L (ref 22–32)
Calcium: 9.2 mg/dL (ref 8.9–10.3)
Creatinine, Ser: 0.79 mg/dL (ref 0.44–1.00)
GFR calc Af Amer: >60 mL/min (ref >60)
GFR calc non Af Amer: >60 mL/min (ref >60)
GLUCOSE: 97 mg/dL (ref 65–99)
POTASSIUM: 3.9 mmol/L (ref 3.5–5.1)
Sodium: 134 mmol/L — ABNORMAL LOW (ref 135–145)
TOTAL PROTEIN: 7 g/dL (ref 6.5–8.1)

## 2016-03-29 LAB — RAPID URINE DRUG SCREEN, HOSP PERFORMED
AMPHETAMINES: NOT DETECTED
BARBITURATES: NOT DETECTED
BENZODIAZEPINES: NOT DETECTED
COCAINE: NOT DETECTED
Opiates: NOT DETECTED
TETRAHYDROCANNABINOL: UNDETERMINED

## 2016-03-29 LAB — CBC WITH DIFFERENTIAL/PLATELET
BASOS ABS: 0 10*3/uL (ref 0.0–0.1)
BASOS PCT: 0 %
Eosinophils Absolute: 0 10*3/uL (ref 0.0–0.7)
Eosinophils Relative: 0 %
HEMATOCRIT: 36.6 % (ref 36.0–46.0)
Hemoglobin: 11.9 g/dL — ABNORMAL LOW (ref 12.0–15.0)
Lymphocytes Relative: 12 %
Lymphs Abs: 0.6 10*3/uL — ABNORMAL LOW (ref 0.7–4.0)
MCH: 27.9 pg (ref 26.0–34.0)
MCHC: 32.5 g/dL (ref 30.0–36.0)
MCV: 85.7 fL (ref 78.0–100.0)
MONO ABS: 0.6 10*3/uL (ref 0.1–1.0)
Monocytes Relative: 12 %
NEUTROS ABS: 3.7 10*3/uL (ref 1.7–7.7)
NEUTROS PCT: 76 %
Platelets: 157 10*3/uL (ref 150–400)
RBC: 4.27 MIL/uL (ref 3.87–5.11)
RDW: 14.5 % (ref 11.5–15.5)
WBC: 4.9 10*3/uL (ref 4.0–10.5)

## 2016-03-29 LAB — URINE MICROSCOPIC-ADD ON

## 2016-03-29 LAB — URINALYSIS, ROUTINE W REFLEX MICROSCOPIC
GLUCOSE, UA: NEGATIVE mg/dL
Hgb urine dipstick: NEGATIVE
Ketones, ur: 15 mg/dL — AB
Nitrite: POSITIVE — AB
PH: 6 (ref 5.0–8.0)
Protein, ur: 30 mg/dL — AB
SPECIFIC GRAVITY, URINE: 1.022 (ref 1.005–1.030)

## 2016-03-29 LAB — LIPASE, BLOOD: Lipase: 16 U/L (ref 11–51)

## 2016-03-29 LAB — HIV ANTIBODY (ROUTINE TESTING W REFLEX): HIV Screen 4th Generation wRfx: NONREACTIVE

## 2016-03-29 LAB — RPR: RPR: NONREACTIVE

## 2016-03-29 LAB — GC/CHLAMYDIA PROBE AMP (~~LOC~~) NOT AT ARMC
CHLAMYDIA, DNA PROBE: NEGATIVE
NEISSERIA GONORRHEA: NEGATIVE

## 2016-03-29 LAB — ETHANOL: Alcohol, Ethyl (B): <5 mg/dL (ref <5)

## 2016-03-29 LAB — POC URINE PREG, ED: Preg Test, Ur: NEGATIVE

## 2016-03-29 MED ORDER — SODIUM CHLORIDE 0.9 % IV BOLUS (SEPSIS)
1000.0000 mL | Freq: Once | INTRAVENOUS | Status: AC
  Administered 2016-03-29: 1000 mL via INTRAVENOUS

## 2016-03-29 MED ORDER — SODIUM CHLORIDE 0.9 % IV BOLUS (SEPSIS)
2000.0000 mL | Freq: Once | INTRAVENOUS | Status: AC
  Administered 2016-03-29: 2000 mL via INTRAVENOUS

## 2016-03-29 NOTE — ED Notes (Signed)
Ambulated patient to the bathroom and back to her room.  Tolerated well.

## 2016-03-29 NOTE — ED Triage Notes (Signed)
Pt was at work and pt got light headed and attempted to sit down. Pt started getting light headed and feeling real hot. Pt attempted to stand up and run to her car. Pt had near syncope. Pt denies losing consciousness.

## 2016-03-29 NOTE — ED Provider Notes (Addendum)
[place] DEPT Provider Note   CSN: [id number] Arrival date & time: 03/29/16  [id number]  First Provider Contact:  First MD Initiated Contact with Patient 03/29/16 0700        History   Chief Complaint Chief Complaint  Patient presents with  . Near Syncope    HPI Penni Kabacinski is a 25 y.o. female.  Patient w recent dx uti yesterday, presents post near syncopal episode.  Patient started on cipro yesterday for uti and bv. Has eaten relatively little in past and nothing today. Went to work this AM at 0430 per normal routine, was standing up, felt faint/lightheaded. Then felt warm/hot, and felt as if may pass out, and went to car to lay down. No associated cp or discomfort. No palpitations or irregular heart beat. No sob. No headache. No abd pain. No vomiting or diarrhea. States urinary symptoms have improved. No hx syncope. Denies any recent blood loss, heavy periods, blood in stool or melena. States low grade fever yesterday.     The history is provided by the patient.  Near Syncope  Pertinent negatives include no chest pain, no abdominal pain, no headaches and no shortness of breath.    Past Medical History:  Diagnosis Date  . Anxiety   . Panic attack     Patient Active Problem List   Diagnosis Date Noted  . Panic attack     History reviewed. No pertinent surgical history.  OB History    Gravida Para Term Preterm AB Living   0 0 0 0 0 0   SAB TAB Ectopic Multiple Live Births   0 0 0 0 0       Home Medications    Prior to Admission medications   Medication Sig Start Date End Date Taking? Authorizing Provider  acetaminophen (TYLENOL) 500 MG tablet Take 1,000 mg by mouth every 6 (six) hours as needed for moderate pain or headache.    Historical Provider, MD  azithromycin (ZITHROMAX) 250 MG tablet 2 tabs daily for 5 days. Patient not taking: Reported on 03/28/2016 04/30/13   Janne Napoleon, NP  ciprofloxacin (CIPRO) 500 MG tablet Take 1 tablet (500 mg total) by mouth 2  (two) times daily. One po bid x 7 days 03/28/16   Malvin Johns, MD  ibuprofen (ADVIL,MOTRIN) 800 MG tablet Take 800 mg by mouth every 8 (eight) hours as needed for pain.    Historical Provider, MD  methocarbamol (ROBAXIN) 500 MG tablet Take 1 tablet (500 mg total) by mouth 2 (two) times daily. Patient not taking: Reported on 03/28/2016 04/13/15   Comer Locket, PA-C  metroNIDAZOLE (FLAGYL) 500 MG tablet Take 1 tablet (500 mg total) by mouth 2 (two) times daily. One po bid x 7 days 03/28/16   Malvin Johns, MD  naproxen (NAPROSYN) 500 MG tablet Take 1 tablet (500 mg total) by mouth 2 (two) times daily. Patient not taking: Reported on 03/28/2016 04/13/15   Comer Locket, PA-C  phenazopyridine (AZO-STANDARD) 95 MG tablet Take 95 mg by mouth 3 (three) times daily as needed for pain.    Historical Provider, MD    Family History History reviewed. No pertinent family history.  Social History Social History  Substance Use Topics  . Smoking status: Never Smoker  . Smokeless tobacco: Never Used  . Alcohol use No     Allergies   Amoxicillin   Review of Systems Review of Systems  Constitutional: Negative for chills and diaphoresis.  HENT: Negative for sore throat.   Eyes: Negative  for visual disturbance.  Respiratory: Negative for shortness of breath.   Cardiovascular: Positive for near-syncope. Negative for chest pain, palpitations and leg swelling.  Gastrointestinal: Negative for abdominal pain, blood in stool, diarrhea and vomiting.  Genitourinary: Negative for flank pain.  Musculoskeletal: Negative for back pain and neck pain.  Skin: Negative for rash.  Neurological: Negative for headaches.  Hematological: Does not bruise/bleed easily.  Psychiatric/Behavioral: Negative for confusion.     Physical Exam Updated Vital Signs BP 104/60 (BP Location: Right Arm)   Pulse 83   Temp 99.5 F (37.5 C) (Oral)   Resp 13   Ht 5\' 8"  (1.727 m)   Wt 70.3 kg   LMP 03/17/2016 (Approximate)    SpO2 98%   BMI 23.57 kg/m   Physical Exam  Constitutional: She is oriented to person, place, and time. She appears well-developed and well-nourished. No distress.  HENT:  Head: Atraumatic.  Mouth/Throat: Oropharynx is clear and moist.  Eyes: Conjunctivae are normal. Pupils are equal, round, and reactive to light. No scleral icterus.  Neck: Neck supple. No tracheal deviation present.  Cardiovascular: Normal rate, regular rhythm, normal heart sounds and intact distal pulses.  Exam reveals no gallop and no friction rub.   No murmur heard. Pulmonary/Chest: Effort normal and breath sounds normal. No respiratory distress.  Abdominal: Soft. Normal appearance and bowel sounds are normal. She exhibits no distension. There is no tenderness.  Genitourinary:  Genitourinary Comments: No cva tenderness  Musculoskeletal: She exhibits no edema.  Neurological: She is alert and oriented to person, place, and time.  Steady gait.   Skin: Skin is warm and dry. No rash noted.  Psychiatric: She has a normal mood and affect.  Nursing note and vitals reviewed.    ED Treatments / Results  Labs (all labs ordered are listed, but only abnormal results are displayed) Results for orders placed or performed during the hospital encounter of 03/29/16  CBC with Differential/Platelet  Result Value Ref Range   WBC 4.9 4.0 - 10.5 K/uL   RBC 4.27 3.87 - 5.11 MIL/uL   Hemoglobin 11.9 (L) 12.0 - 15.0 g/dL   HCT 36.6 36.0 - 46.0 %   MCV 85.7 78.0 - 100.0 fL   MCH 27.9 26.0 - 34.0 pg   MCHC 32.5 30.0 - 36.0 g/dL   RDW 14.5 11.5 - 15.5 %   Platelets 157 150 - 400 K/uL   Neutrophils Relative % 76 %   Neutro Abs 3.7 1.7 - 7.7 K/uL   Lymphocytes Relative 12 %   Lymphs Abs 0.6 (L) 0.7 - 4.0 K/uL   Monocytes Relative 12 %   Monocytes Absolute 0.6 0.1 - 1.0 K/uL   Eosinophils Relative 0 %   Eosinophils Absolute 0.0 0.0 - 0.7 K/uL   Basophils Relative 0 %   Basophils Absolute 0.0 0.0 - 0.1 K/uL  Comprehensive  metabolic panel  Result Value Ref Range   Sodium 134 (L) 135 - 145 mmol/L   Potassium 3.9 3.5 - 5.1 mmol/L   Chloride 101 101 - 111 mmol/L   CO2 24 22 - 32 mmol/L   Glucose, Bld 97 65 - 99 mg/dL   BUN 7 6 - 20 mg/dL   Creatinine, Ser 0.79 0.44 - 1.00 mg/dL   Calcium 9.2 8.9 - 10.3 mg/dL   Total Protein 7.0 6.5 - 8.1 g/dL   Albumin 4.0 3.5 - 5.0 g/dL   AST 17 15 - 41 U/L   ALT 11 (L) 14 - 54 U/L  Alkaline Phosphatase 61 38 - 126 U/L   Total Bilirubin 0.8 0.3 - 1.2 mg/dL   GFR calc non Af Amer >60 >60 mL/min   GFR calc Af Amer >60 >60 mL/min   Anion gap 9 5 - 15  Lipase, blood  Result Value Ref Range   Lipase 16 11 - 51 U/L  Urinalysis, Routine w reflex microscopic (not at [facility])  Result Value Ref Range   Color, Urine AMBER (A) YELLOW   APPearance CLOUDY (A) CLEAR   Specific Gravity, Urine 1.022 1.005 - 1.030   pH 6.0 5.0 - 8.0   Glucose, UA NEGATIVE NEGATIVE mg/dL   Hgb urine dipstick NEGATIVE NEGATIVE   Bilirubin Urine SMALL (A) NEGATIVE   Ketones, ur 15 (A) NEGATIVE mg/dL   Protein, ur 30 (A) NEGATIVE mg/dL   Nitrite POSITIVE (A) NEGATIVE   Leukocytes, UA SMALL (A) NEGATIVE  Urine rapid drug screen (hosp performed)  Result Value Ref Range   Opiates NONE DETECTED NONE DETECTED   Cocaine NONE DETECTED NONE DETECTED   Benzodiazepines NONE DETECTED NONE DETECTED   Amphetamines NONE DETECTED NONE DETECTED   Tetrahydrocannabinol PENDING NONE DETECTED   Barbiturates NONE DETECTED NONE DETECTED  Ethanol  Result Value Ref Range   Alcohol, Ethyl (B) <5 <5 mg/dL  Urine microscopic-add on  Result Value Ref Range   Squamous Epithelial / LPF 6-30 (A) NONE SEEN   WBC, UA 6-30 0 - 5 WBC/hpf   RBC / HPF 0-5 0 - 5 RBC/hpf   Bacteria, UA FEW (A) NONE SEEN   Urine-Other MUCOUS PRESENT   POC urine preg, ED (not at [facility])  Result Value Ref Range   Preg Test, Ur NEGATIVE NEGATIVE   EKG  EKG Interpretation None       Radiology No results found.  Procedures Procedures  (including critical care time)  Medications Ordered in ED Medications  sodium chloride 0.9 % bolus 2,000 mL (not administered)  sodium chloride 0.9 % bolus 1,000 mL (not administered)     Initial Impression / Assessment and Plan / ED Course  I have reviewed the triage vital signs and the nursing notes.  Pertinent labs & imaging results that were available during my care of the patient were reviewed by me and considered in my medical decision making (see chart for details).  Clinical Course   Iv ns bolus. Labs sent.  Patient indicates hungry/thirsty as hasnt eaten today.  Recheck feels much improved. No faintness or dizziness. nsr on monitor.  Patient asymptomatic, and appears stable for d/c.   Po fluids, happy meal.  Reviewed nursing notes and prior charts for additional history. During prior ED visit 2014, bp 98/60 then.    On recheck, patient asymptomatic, feels improved.      Final Clinical Impressions(s) / ED Diagnoses   Final diagnoses:  None    New Prescriptions New Prescriptions   No medications on file         Lajean Saver, MD 03/29/16 [phone]

## 2016-03-29 NOTE — ED Notes (Signed)
Patient provided cranberry juice per provider's request to offer fluids.

## 2016-03-29 NOTE — Discharge Instructions (Signed)
It was our pleasure to provide your ER care today - we hope that you feel better.  Rest. Drink plenty of fluids.  Take your antibiotic as prescribed yesterday for urine infection.  Follow up with primary care doctor in the coming week if symptoms fail to improve/resolve.  Return to ER if worse, new symptoms, persistent vomiting, high fevers, weak/fainting, other concern.

## 2016-03-29 NOTE — ED Notes (Signed)
Pt to be discharged after at least 1 L NS has infused per Dr.steinl. IV fluids placed on pump.

## 2016-03-30 LAB — URINE CULTURE: Culture: NO GROWTH

## 2016-09-06 ENCOUNTER — Emergency Department (HOSPITAL_COMMUNITY)
Admission: EM | Admit: 2016-09-06 | Discharge: 2016-09-06 | Disposition: A | Discharge status: Home / Self Care | Payer: Self-pay | Attending: Emergency Medicine | Admitting: Emergency Medicine

## 2016-09-06 ENCOUNTER — Encounter (HOSPITAL_COMMUNITY): Payer: Self-pay

## 2016-09-06 DIAGNOSIS — Z79899 Other long term (current) drug therapy: Secondary | ICD-10-CM | POA: Insufficient documentation

## 2016-09-06 DIAGNOSIS — J069 Acute upper respiratory infection, unspecified: Secondary | ICD-10-CM | POA: Insufficient documentation

## 2016-09-06 DIAGNOSIS — B9789 Other viral agents as the cause of diseases classified elsewhere: Secondary | ICD-10-CM

## 2016-09-06 MED ORDER — GUAIFENESIN-DM 100-10 MG/5ML PO SYRP: 5.0000 mL | ORAL_SOLUTION | ORAL | 0 refills | Status: DC | PRN

## 2016-09-06 MED ORDER — BENZONATATE 100 MG PO CAPS
100.0000 mg | ORAL_CAPSULE | Freq: Three times a day (TID) | ORAL | 0 refills | Status: DC
Start: 2016-09-06 — End: 2017-06-08

## 2016-09-06 NOTE — ED Notes (Signed)
EDP at bedside  

## 2016-09-06 NOTE — Discharge Instructions (Signed)
Ibuprofen or tylenol for fever, body aches, pain. Make sure to drink plenty of fluids. Take cough medication as prescribed. Rest. Follow up with primary care doctor or urgent care if not improving in 3-5 days. Return if worsening symptoms.

## 2016-09-06 NOTE — ED Triage Notes (Signed)
Pt,. Woke up yesterday with  Cough  Chills and body aches.  She denies any sore throat or nasal congestion.

## 2016-09-06 NOTE — ED Provider Notes (Signed)
[place] DEPT Provider Note   CSN: [id number] Arrival date & time: 09/06/16  1009  By signing my name below, I, Avnee Patel, attest that this documentation has been prepared under the direction and in the presence of  Kolten Ryback, PA-C. Electronically Signed: Delton Prairie, ED Scribe. 09/06/16. 10:35 AM.  History   Chief Complaint Chief Complaint  Patient presents with  . Cough   The history is provided by the patient. No language interpreter was used.   HPI Comments:  April Riddle is a 26 y.o. female who presents to the Emergency Department complaining of generalized body aches x yesterday. Pt also reports a productive cough with green sputum, subjective fever, chills, diaphoresis, abdominal cramping and nausea. Pt reports she is on her period which may be causing her abdominal cramping. She has had TheraFlu with little relief. Pt denies sore throat, rhinorrhea, diarrhea, vomiting and a chance of being pregnant. Pt also denies having her flu shot this year. Pt is a smoker.   Past Medical History:  Diagnosis Date  . Anxiety   . Panic attack     Patient Active Problem List   Diagnosis Date Noted  . Panic attack     History reviewed. No pertinent surgical history.  OB History    Gravida Para Term Preterm AB Living   0 0 0 0 0 0   SAB TAB Ectopic Multiple Live Births   0 0 0 0 0       Home Medications    Prior to Admission medications   Medication Sig Start Date End Date Taking? Authorizing Provider  acetaminophen (TYLENOL) 500 MG tablet Take 1,000 mg by mouth every 6 (six) hours as needed for moderate pain or headache.    Historical Provider, MD  azithromycin (ZITHROMAX) 250 MG tablet 2 tabs daily for 5 days. Patient not taking: Reported on 03/28/2016 04/30/13   Janne Napoleon, NP  ciprofloxacin (CIPRO) 500 MG tablet Take 1 tablet (500 mg total) by mouth 2 (two) times daily. One po bid x 7 days 03/28/16   Malvin Johns, MD  ibuprofen (ADVIL,MOTRIN) 800 MG tablet Take  800 mg by mouth every 8 (eight) hours as needed for pain.    Historical Provider, MD  methocarbamol (ROBAXIN) 500 MG tablet Take 1 tablet (500 mg total) by mouth 2 (two) times daily. Patient not taking: Reported on 03/28/2016 04/13/15   Comer Locket, PA-C  metroNIDAZOLE (FLAGYL) 500 MG tablet Take 1 tablet (500 mg total) by mouth 2 (two) times daily. One po bid x 7 days 03/28/16   Malvin Johns, MD  naproxen (NAPROSYN) 500 MG tablet Take 1 tablet (500 mg total) by mouth 2 (two) times daily. Patient not taking: Reported on 03/28/2016 04/13/15   Comer Locket, PA-C  phenazopyridine (AZO-STANDARD) 95 MG tablet Take 95 mg by mouth 3 (three) times daily as needed for pain.    Historical Provider, MD    Family History No family history on file.  Social History Social History  Substance Use Topics  . Smoking status: Never Smoker  . Smokeless tobacco: Never Used  . Alcohol use No     Allergies   Amoxicillin   Review of Systems Review of Systems  Constitutional: Positive for chills and diaphoresis. Negative for fever.  HENT: Negative for rhinorrhea and sore throat.   Respiratory: Positive for cough.   Gastrointestinal: Positive for abdominal pain and nausea. Negative for diarrhea and vomiting.  Musculoskeletal: Positive for myalgias.  Neurological: Negative for headaches.   Physical  Exam Updated Vital Signs BP 110/60 (BP Location: Right Arm)   Pulse 70   Temp 98.4 F (36.9 C) (Oral)   Resp 18   Ht 5\' 8"  (1.727 m)   Wt 158 lb 1.6 oz (71.7 kg)   LMP 09/04/2016   SpO2 99%   BMI 24.04 kg/m   Physical Exam  Constitutional: She is oriented to person, place, and time. She appears well-developed and well-nourished. No distress.  HENT:  Head: Normocephalic and atraumatic.  Right Ear: External ear normal.  Left Ear: External ear normal.  Fluid behind right TM, otherwise bilateral TMs are nonerythematous, no bulging. Oropharynx is normal.  Eyes: Conjunctivae are normal.  Neck:  Normal range of motion. Neck supple.  No meningismus  Cardiovascular: Normal rate, regular rhythm and normal heart sounds.   Pulmonary/Chest: Effort normal and breath sounds normal. No respiratory distress. She has no wheezes. She has no rales.  Abdominal: She exhibits no distension.  Musculoskeletal: Normal range of motion.  Neurological: She is alert and oriented to person, place, and time.  Skin: Skin is warm and dry.  Psychiatric: She has a normal mood and affect.  Nursing note and vitals reviewed.   ED Treatments / Results  DIAGNOSTIC STUDIES:  Oxygen Saturation is 99% on RA, normal by my interpretation.    COORDINATION OF CARE:  10:35 AM Discussed treatment plan with pt at bedside and pt agreed to plan.  Labs (all labs ordered are listed, but only abnormal results are displayed) Labs Reviewed - No data to display  EKG  EKG Interpretation None       Radiology No results found.  Procedures Procedures (including critical care time)  Medications Ordered in ED Medications - No data to display   Initial Impression / Assessment and Plan / ED Course  I have reviewed the triage vital signs and the nursing notes.  Pertinent labs & imaging results that were available during my care of the patient were reviewed by me and considered in my medical decision making (see chart for details).  Clinical Course    Patient in emergency department with cough, body aches, chills onset yesterday. She is nontoxic appearing on exam. Vital signs are all within normal. She is afebrile. Oxygen saturation 99%, lungs are clear. No evidence of otitis or pharyngeal infections. She is not vomiting. Abdomen is benign. Most likely viral upper respiratory tract infection, possibly influenza. Discussed symptomatic treatment. Discussed symptom that should prompt her return. Otherwise follow-up with family doctor. Will treat with Tessalon, Robitussin DM, follow-up.  Vitals:   09/06/16 1013  BP:  110/60  Pulse: 70  Resp: 18  Temp: 98.4 F (36.9 C)      Final Clinical Impressions(s) / ED Diagnoses   Final diagnoses:  Viral URI with cough    New Prescriptions New Prescriptions   BENZONATATE (TESSALON) 100 MG CAPSULE    Take 1 capsule (100 mg total) by mouth every 8 (eight) hours.   GUAIFENESIN-DEXTROMETHORPHAN (ROBITUSSIN DM) 100-10 MG/5ML SYRUP    Take 5 mLs by mouth every 4 (four) hours as needed for cough.    I personally performed the services described in this documentation, which was scribed in my presence. The recorded information has been reviewed and is accurate.     Jeannett Senior, PA-C 09/06/16 [place], MD 09/07/16 [phone]

## 2016-09-09 ENCOUNTER — Emergency Department (HOSPITAL_COMMUNITY)
Admission: EM | Admit: 2016-09-09 | Discharge: 2016-09-09 | Disposition: A | Discharge status: Home / Self Care | Payer: Self-pay | Attending: Emergency Medicine | Admitting: Emergency Medicine

## 2016-09-09 ENCOUNTER — Emergency Department (HOSPITAL_COMMUNITY): Payer: Self-pay

## 2016-09-09 ENCOUNTER — Encounter (HOSPITAL_COMMUNITY): Payer: Self-pay

## 2016-09-09 DIAGNOSIS — B349 Viral infection, unspecified: Secondary | ICD-10-CM | POA: Insufficient documentation

## 2016-09-09 LAB — BASIC METABOLIC PANEL
Anion gap: 9 (ref 5–15)
BUN: <5 mg/dL — ABNORMAL LOW (ref 6–20)
CALCIUM: 9.1 mg/dL (ref 8.9–10.3)
CO2: 24 mmol/L (ref 22–32)
CREATININE: 0.6 mg/dL (ref 0.44–1.00)
Chloride: 104 mmol/L (ref 101–111)
GFR calc Af Amer: >60 mL/min (ref >60)
GFR calc non Af Amer: >60 mL/min (ref >60)
GLUCOSE: 97 mg/dL (ref 65–99)
Potassium: 3.9 mmol/L (ref 3.5–5.1)
Sodium: 137 mmol/L (ref 135–145)

## 2016-09-09 LAB — CBC WITH DIFFERENTIAL/PLATELET
BASOS PCT: 1 %
Basophils Absolute: 0 10*3/uL (ref 0.0–0.1)
EOS PCT: 2 %
Eosinophils Absolute: 0 10*3/uL (ref 0.0–0.7)
HEMATOCRIT: 35.7 % — AB (ref 36.0–46.0)
Hemoglobin: 11.7 g/dL — ABNORMAL LOW (ref 12.0–15.0)
LYMPHS ABS: 0.8 10*3/uL (ref 0.7–4.0)
Lymphocytes Relative: 42 %
MCH: 27.1 pg (ref 26.0–34.0)
MCHC: 32.8 g/dL (ref 30.0–36.0)
MCV: 82.6 fL (ref 78.0–100.0)
MONO ABS: 0.3 10*3/uL (ref 0.1–1.0)
Monocytes Relative: 15 %
NEUTROS ABS: 0.9 10*3/uL — AB (ref 1.7–7.7)
Neutrophils Relative %: 40 %
Platelets: 180 10*3/uL (ref 150–400)
RBC: 4.32 MIL/uL (ref 3.87–5.11)
RDW: 15 % (ref 11.5–15.5)
WBC: 2 10*3/uL — AB (ref 4.0–10.5)

## 2016-09-09 LAB — I-STAT BETA HCG BLOOD, ED (MC, WL, AP ONLY): I-stat hCG, quantitative: <5 m[IU]/mL (ref <5)

## 2016-09-09 MED ORDER — ONDANSETRON 4 MG PO TBDP
4.0000 mg | ORAL_TABLET | Freq: Once | ORAL | Status: AC
  Administered 2016-09-09: 4 mg via ORAL

## 2016-09-09 MED ORDER — ONDANSETRON HCL 4 MG PO TABS: 4.0000 mg | ORAL_TABLET | Freq: Four times a day (QID) | ORAL | 0 refills | Status: DC

## 2016-09-09 MED ORDER — ONDANSETRON 4 MG PO TBDP
ORAL_TABLET | ORAL | Status: AC
  Filled 2016-09-09: qty 1

## 2016-09-09 MED ORDER — SODIUM CHLORIDE 0.9 % IV BOLUS (SEPSIS)
1000.0000 mL | Freq: Once | INTRAVENOUS | Status: AC
  Administered 2016-09-09: 1000 mL via INTRAVENOUS

## 2016-09-09 NOTE — ED Notes (Signed)
Warm blanket given to pt. In triage

## 2016-09-09 NOTE — ED Provider Notes (Signed)
[place] DEPT Provider Note   CSN: [id number] Arrival date & time: 09/09/16  0913   History   Chief Complaint Chief Complaint  Patient presents with  . Cough  . Nausea    HPI April Riddle is a 26 y.o. female.  HPI   Patient has PMH of anxiety and panic attacks comes to the ER for re-evaluation of flu like symptoms. Her work is not letting her return because of the nausea and vomiting. She has been having coughing, nausea, vomiting with body aches and chills. She says that she is feeling worse and has been sick for 5 days now. SHe has been able to tolerate fluids. Denies having any abdominal pains. She has been taking the cough medication prescribed to her at the last visit. She did not get the flu shot this year, her mom tested positive for flu and was started on Tamiflu. Overall the patient is well appearing with normal vital signs   Past Medical History:  Diagnosis Date  . Anxiety   . Panic attack     Patient Active Problem List   Diagnosis Date Noted  . Panic attack     History reviewed. No pertinent surgical history.  OB History    Gravida Para Term Preterm AB Living   0 0 0 0 0 0   SAB TAB Ectopic Multiple Live Births   0 0 0 0 0       Home Medications    Prior to Admission medications   Medication Sig Start Date End Date Taking? Authorizing Provider  acetaminophen (TYLENOL) 500 MG tablet Take 1,000 mg by mouth every 6 (six) hours as needed for moderate pain or headache.    Historical Provider, MD  azithromycin (ZITHROMAX) 250 MG tablet 2 tabs daily for 5 days. Patient not taking: Reported on 03/28/2016 04/30/13   Janne Napoleon, NP  benzonatate (TESSALON) 100 MG capsule Take 1 capsule (100 mg total) by mouth every 8 (eight) hours. 09/06/16   Tatyana Kirichenko, PA-C  ciprofloxacin (CIPRO) 500 MG tablet Take 1 tablet (500 mg total) by mouth 2 (two) times daily. One po bid x 7 days 03/28/16   Malvin Johns, MD  guaiFENesin-dextromethorphan (ROBITUSSIN DM) 100-10  MG/5ML syrup Take 5 mLs by mouth every 4 (four) hours as needed for cough. 09/06/16   Tatyana Kirichenko, PA-C  ibuprofen (ADVIL,MOTRIN) 800 MG tablet Take 800 mg by mouth every 8 (eight) hours as needed for pain.    Historical Provider, MD  methocarbamol (ROBAXIN) 500 MG tablet Take 1 tablet (500 mg total) by mouth 2 (two) times daily. Patient not taking: Reported on 03/28/2016 04/13/15   Comer Locket, PA-C  metroNIDAZOLE (FLAGYL) 500 MG tablet Take 1 tablet (500 mg total) by mouth 2 (two) times daily. One po bid x 7 days 03/28/16   Malvin Johns, MD  naproxen (NAPROSYN) 500 MG tablet Take 1 tablet (500 mg total) by mouth 2 (two) times daily. Patient not taking: Reported on 03/28/2016 04/13/15   Comer Locket, PA-C  ondansetron (ZOFRAN) 4 MG tablet Take 1 tablet (4 mg total) by mouth every 6 (six) hours. 09/09/16   Artez Regis Carlota Raspberry, PA-C  phenazopyridine (AZO-STANDARD) 95 MG tablet Take 95 mg by mouth 3 (three) times daily as needed for pain.    Historical Provider, MD    Family History No family history on file.  Social History Social History  Substance Use Topics  . Smoking status: Never Smoker  . Smokeless tobacco: Never Used  . Alcohol use No  Allergies   Amoxicillin   Review of Systems Review of Systems Review of Systems All other systems negative except as documented in the HPI. All pertinent positives and negatives as reviewed in the HPI.   Physical Exam Updated Vital Signs BP 112/80   Pulse 60   Temp 99 F (37.2 C) (Oral)   Resp 18   Ht 5\' 8"  (1.727 m)   Wt 71.7 kg   LMP 09/04/2016   SpO2 100%   BMI 24.02 kg/m   Physical Exam  Constitutional: She appears well-developed and well-nourished. No distress.  HENT:  Head: Normocephalic and atraumatic.  Right Ear: Tympanic membrane and ear canal normal.  Left Ear: Tympanic membrane and ear canal normal.  Nose: Nose normal.  Mouth/Throat: Uvula is midline, oropharynx is clear and moist and mucous membranes are  normal.  Eyes: Pupils are equal, round, and reactive to light.  Neck: Normal range of motion. Neck supple.  Cardiovascular: Normal rate and regular rhythm.   Pulmonary/Chest: Effort normal.  Abdominal: Soft.  No signs of abdominal distention  Musculoskeletal:  No LE swelling  Neurological: She is alert.  Acting at baseline  Skin: Skin is warm and dry. No rash noted.  Nursing note and vitals reviewed.    ED Treatments / Results  Labs (all labs ordered are listed, but only abnormal results are displayed) Labs Reviewed  CBC WITH DIFFERENTIAL/PLATELET - Abnormal; Notable for the following:       Result Value   WBC 2.0 (*)    Hemoglobin 11.7 (*)    HCT 35.7 (*)    Neutro Abs 0.9 (*)    All other components within normal limits  BASIC METABOLIC PANEL - Abnormal; Notable for the following:    BUN <5 (*)    All other components within normal limits  I-STAT BETA HCG BLOOD, ED ([facility], [facility], [facility] ONLY)    EKG  EKG Interpretation None       Radiology Dg Chest 2 View  Result Date: 09/09/2016 CLINICAL DATA:  26 year old with cough, congestion, headache and body aches for 3 days. EXAM: CHEST  2 VIEW COMPARISON:  03/15/2013 and 10/17/2012 FINDINGS: The heart size and mediastinal contours are normal. The lungs are clear. There is no pleural effusion or pneumothorax. No acute osseous findings are identified.   IMPRESSION: Stable chest.  No active cardiopulmonary process. Electronically Signed   By: Richardean Sale M.D.   On: 09/09/2016 11:52    Procedures Procedures (including critical care time)  Medications Ordered in ED Medications  ondansetron (ZOFRAN-ODT) 4 MG disintegrating tablet (not administered)  sodium chloride 0.9 % bolus 1,000 mL (1,000 mLs Intravenous New Bag/Given 09/09/16 1323)  ondansetron (ZOFRAN-ODT) disintegrating tablet 4 mg (4 mg Oral Given 09/09/16 1001)     Initial Impression / Assessment and Plan / ED Course  I have reviewed the triage vital signs and the  nursing notes.  Pertinent labs & imaging results that were available during my care of the patient were reviewed by me and considered in my medical decision making (see chart for details).     blood work shows low WBC at 2.0, this is likely reactive from virus, I will recommend she have this rechecked in 1 month, otherwise labs urnemarkable. Pt given liter of fluids for symptoms in ED and reports feeling much better. Able to tolerate PO.  Pt symptoms consistent with URI. CXR negative for acute infiltrate. Pt will be discharged with symptomatic treatment.  Discussed return precautions.  Pt is hemodynamically  stable & in NAD prior to discharge.     Final Clinical Impressions(s) / ED Diagnoses   Final diagnoses:  Viral illness    New Prescriptions New Prescriptions   ONDANSETRON (ZOFRAN) 4 MG TABLET    Take 1 tablet (4 mg total) by mouth every 6 (six) hours.     Delos Haring, PA-C 09/09/16 [place], MD 09/22/16 [phone]

## 2016-09-09 NOTE — ED Triage Notes (Signed)
Pt. Was seen by [place] a few days ago with a cough, chills and body aches.  She is feeling worse and continues with those symptoms and she also has n/v/d   Her job will not let her return.  Mask applied in Triage.  Pt. Is afebrile.  Skin is warm and dry.

## 2016-09-13 LAB — PATHOLOGIST SMEAR REVIEW

## 2017-06-08 ENCOUNTER — Emergency Department (HOSPITAL_COMMUNITY): Payer: Commercial Managed Care - PPO

## 2017-06-08 ENCOUNTER — Emergency Department (HOSPITAL_COMMUNITY)
Admission: EM | Admit: 2017-06-08 | Discharge: 2017-06-08 | Disposition: A | Discharge status: Home / Self Care | Payer: Commercial Managed Care - PPO | Attending: Emergency Medicine | Admitting: Emergency Medicine

## 2017-06-08 ENCOUNTER — Encounter (HOSPITAL_COMMUNITY): Payer: Self-pay | Admitting: Emergency Medicine

## 2017-06-08 DIAGNOSIS — Z3A00 Weeks of gestation of pregnancy not specified: Secondary | ICD-10-CM | POA: Diagnosis not present

## 2017-06-08 DIAGNOSIS — M545 Low back pain, unspecified: Secondary | ICD-10-CM

## 2017-06-08 DIAGNOSIS — O23591 Infection of other part of genital tract in pregnancy, first trimester: Secondary | ICD-10-CM | POA: Diagnosis not present

## 2017-06-08 DIAGNOSIS — Z3490 Encounter for supervision of normal pregnancy, unspecified, unspecified trimester: Secondary | ICD-10-CM

## 2017-06-08 DIAGNOSIS — N760 Acute vaginitis: Secondary | ICD-10-CM

## 2017-06-08 DIAGNOSIS — R109 Unspecified abdominal pain: Secondary | ICD-10-CM | POA: Diagnosis present

## 2017-06-08 DIAGNOSIS — B9689 Other specified bacterial agents as the cause of diseases classified elsewhere: Secondary | ICD-10-CM

## 2017-06-08 LAB — URINALYSIS, ROUTINE W REFLEX MICROSCOPIC
BILIRUBIN URINE: NEGATIVE
GLUCOSE, UA: NEGATIVE mg/dL
HGB URINE DIPSTICK: NEGATIVE
KETONES UR: NEGATIVE mg/dL
Leukocytes, UA: NEGATIVE
Nitrite: NEGATIVE
PH: 5 (ref 5.0–8.0)
PROTEIN: NEGATIVE mg/dL
Specific Gravity, Urine: 1.031 — ABNORMAL HIGH (ref 1.005–1.030)

## 2017-06-08 LAB — WET PREP, GENITAL
SPERM: NONE SEEN
TRICH WET PREP: NONE SEEN
YEAST WET PREP: NONE SEEN

## 2017-06-08 LAB — POC URINE PREG, ED: Preg Test, Ur: POSITIVE — AB

## 2017-06-08 LAB — HCG, QUANTITATIVE, PREGNANCY: hCG, Beta Chain, Quant, S: 476 m[IU]/mL — ABNORMAL HIGH (ref <5)

## 2017-06-08 MED ORDER — METRONIDAZOLE 0.75 % VA GEL: 1.0000 | Freq: Two times a day (BID) | VAGINAL | 0 refills | Status: DC

## 2017-06-08 NOTE — ED Triage Notes (Signed)
Pt c/o back spasms that started 5 days ago-- muscle cramping per pt-- no known injury. Also states that she is 1 week late on period.

## 2017-06-08 NOTE — ED Provider Notes (Signed)
[facility] EMERGENCY DEPARTMENT Provider Note   CSN: [id number] Arrival date & time: 06/08/17  1024     History   Chief Complaint Chief Complaint  Patient presents with  . Back Pain     HPI   Blood pressure 110/66, pulse 80, temperature 98.3 F (36.8 C), temperature source Oral, resp. rate (!) 4, last menstrual period 05/02/2017, SpO2 100 %.  April Riddle is a 26 y.o. female complaining ofat her lower back spasms starting 4 days ago she began taking ibuprofen at home with little relief. She denies any numbness, weakness, incontinence, fever, chills, history of cancer, history of IV she also reports that she is one week late on her period states that she is concerned that she may be pregnant. Does not take any birth control pills. On review of systems she notes a 6 out of 10 lower abdominal pain which she states that she's had for 3 days. Is been no abnormal vaginal discharge, lightheadedness, passing out, palpitations, shortness of breath, dyspnea on exertion.  Past Medical History:  Diagnosis Date  . Anxiety   . Panic attack     Patient Active Problem List   Diagnosis Date Noted  . Panic attack     History reviewed. No pertinent surgical history.  OB History    Gravida Para Term Preterm AB Living   0 0 0 0 0 0   SAB TAB Ectopic Multiple Live Births   0 0 0 0 0       Home Medications    Prior to Admission medications   Medication Sig Start Date End Date Taking? Authorizing Provider  acetaminophen (TYLENOL) 500 MG tablet Take 1,000 mg by mouth every 6 (six) hours as needed for moderate pain or headache.   Yes [provider]  ibuprofen (ADVIL,MOTRIN) 800 MG tablet Take 800 mg by mouth every 8 (eight) hours as needed for pain.   Yes [provider]  phenazopyridine (AZO-STANDARD) 95 MG tablet Take 95 mg by mouth 3 (three) times daily as needed for pain.   Yes [provider]  metroNIDAZOLE (METROGEL VAGINAL) 0.75 %  vaginal gel Place 1 Applicatorful vaginally 2 (two) times daily. 06/08/17   Joellen Tullos, Charna Elizabeth    Family History No family history on file.  Social History Social History  Substance Use Topics  . Smoking status: Never Smoker  . Smokeless tobacco: Never Used  . Alcohol use No     Allergies   Amoxicillin   Review of Systems Review of Systems  A complete review of systems was obtained and all systems are negative except as noted in the HPI and PMH.    Physical Exam Updated Vital Signs BP 110/66 (BP Location: Left Arm)   Pulse 80   Temp 98.3 F (36.8 C) (Oral)   Resp 14   LMP 05/02/2017   SpO2 100%   Physical Exam  Constitutional: She is oriented to person, place, and time. She appears well-developed and well-nourished. No distress.  HENT:  Head: Normocephalic and atraumatic.  Mouth/Throat: Oropharynx is clear and moist.  Eyes: Pupils are equal, round, and reactive to light. Conjunctivae and EOM are normal.  Neck: Normal range of motion.  Cardiovascular: Normal rate, regular rhythm and intact distal pulses.   Pulmonary/Chest: Effort normal and breath sounds normal.  Abdominal: Soft. There is no tenderness.  No tenderness to deep palpation of any quadrant, no guarding or rebound. Normal active bowel sounds.  Genitourinary:  Genitourinary Comments: Pelvic exam a  chaperoned by nurse, no rashes or lesions, scant, white, homogenous, non-self smelling vaginal discharge, no adnexal or cervical motion tenderness.  Musculoskeletal: Normal range of motion.  Neurological: She is alert and oriented to person, place, and time.  No point tenderness to percussion of lumbar spinal processes.  No TTP or paraspinal muscular spasm. Strength is 5 out of 5 to bilateral lower extremities at hip and knee; extensor hallucis longus 5 out of 5. Ankle strength 5 out of 5, no clonus, neurovascularly intact. No saddle anaesthesia. Patellar reflexes are 2+ bilaterally.     Skin: She is not  diaphoretic.  Psychiatric: She has a normal mood and affect.  Nursing note and vitals reviewed.    ED Treatments / Results  Labs (all labs ordered are listed, but only abnormal results are displayed) Labs Reviewed  WET PREP, GENITAL - Abnormal; Notable for the following:       Result Value   Clue Cells Wet Prep HPF POC PRESENT (*)    WBC, Wet Prep HPF POC RARE (*)    All other components within normal limits  URINALYSIS, ROUTINE W REFLEX MICROSCOPIC - Abnormal; Notable for the following:    Specific Gravity, Urine 1.031 (*)    All other components within normal limits  HCG, QUANTITATIVE, PREGNANCY - Abnormal; Notable for the following:    hCG, Beta Chain, Quant, S 476 (*)    All other components within normal limits  POC URINE PREG, ED - Abnormal; Notable for the following:    Preg Test, Ur POSITIVE (*)    All other components within normal limits  RPR  HIV ANTIBODY (ROUTINE TESTING)  GC/CHLAMYDIA PROBE AMP ([place]) NOT AT [facility]    EKG  EKG Interpretation None       Radiology US Ob Comp Less 14 Wks  Result Date: 06/08/2017 CLINICAL DATA:  Low pelvic pain, late with menstrual cycle, positive pregnancy test indicating early pregnancy EXAM: OBSTETRIC <14 WK [place] AND TRANSVAGINAL OB US TECHNIQUE: Both transabdominal and transvaginal ultrasound examinations were performed for complete evaluation of the gestation as well as the maternal uterus, adnexal regions, and pelvic cul-de-sac. Transvaginal technique was performed to assess early pregnancy. COMPARISON:  None. FINDINGS: Intrauterine gestational sac: Not visualized Subchorionic hemorrhage:  None visualized. Maternal uterus/adnexae: Prominent endometrium is identified although no gestational sac is seen. Left ovary is not visualized due to overlying bowel gas. A cystic lesion is noted within the right ovary measuring approximately 14 mm. Minimal free pelvic fluid is noted which may be physiologic in nature. IMPRESSION: No  evidence of intrauterine gestational sac despite an early positive pregnancy test. Correlation with beta HCG levels and clinical symptomatology is recommended. Follow-up examination can be performed as clinically necessary. Electronically Signed   By: Inez Catalina M.D.   On: 06/08/2017 14:24   US Ob Transvaginal  Result Date: 06/08/2017 CLINICAL DATA:  Low pelvic pain, late with menstrual cycle, positive pregnancy test indicating early pregnancy EXAM: OBSTETRIC <14 WK [place] AND TRANSVAGINAL OB US TECHNIQUE: Both transabdominal and transvaginal ultrasound examinations were performed for complete evaluation of the gestation as well as the maternal uterus, adnexal regions, and pelvic cul-de-sac. Transvaginal technique was performed to assess early pregnancy. COMPARISON:  None. FINDINGS: Intrauterine gestational sac: Not visualized Subchorionic hemorrhage:  None visualized. Maternal uterus/adnexae: Prominent endometrium is identified although no gestational sac is seen. Left ovary is not visualized due to overlying bowel gas. A cystic lesion is noted within the right ovary measuring approximately 14 mm. Minimal free  pelvic fluid is noted which may be physiologic in nature. IMPRESSION: No evidence of intrauterine gestational sac despite an early positive pregnancy test. Correlation with beta HCG levels and clinical symptomatology is recommended. Follow-up examination can be performed as clinically necessary. Electronically Signed   By: Inez Catalina M.D.   On: 06/08/2017 14:24    Procedures Procedures (including critical care time)  Medications Ordered in ED Medications - No data to display   Initial Impression / Assessment and Plan / ED Course  I have reviewed the triage vital signs and the nursing notes.  Pertinent labs & imaging results that were available during my care of the patient were reviewed by me and considered in my medical decision making (see chart for details).     Vitals:   06/08/17  1113 06/08/17 1530  BP: 110/62 110/66  Pulse: (!) 59 80  Resp: 18 14  Temp: 98.3 F (36.8 C) 98.3 F (36.8 C)  TempSrc: Oral Oral  SpO2: 100% 100%    Medications - No data to display  Lawanna Cecere is 26 y.o. female presenting with low back pain and she is also reporting that she's one week late with her period. Urine pregnancy test is positive. She is reporting 6 out of 10 lower abdominal pain only on review of systems. My abdominal exam is benign. I think it is very unlikely ectopic however given her new pregnancy and her reported discomfort I will obtain quant and ultrasound.  Pelvic exam reassuring however, clue cells seen on wet prep.  Quantitative hCG is under 500, ultrasound with no intrauterine gestational sac, not atypical for a quant this low. OB/GYN consult from Dr. Ihor Dow appreciated: She agrees with 48 hour quantitative hCG recheck in good ectopic precautions with discharge to home. Recommends MetroGel for treatment of possible bacterial vaginosis with clue cells  Extensive discussion of return precautions for ectopic, patient verbalized understanding and teach back technique.  Evaluation does not show pathology that would require ongoing emergent intervention or inpatient treatment. Pt is hemodynamically stable and mentating appropriately. Discussed findings and plan with patient/guardian, who agrees with care plan. All questions answered. Return precautions discussed and outpatient follow up given.     Final Clinical Impressions(s) / ED Diagnoses   Final diagnoses:  Low back pain without sciatica, unspecified back pain laterality, unspecified chronicity  Pregnancy at early stage  Bacterial vaginosis    New Prescriptions Discharge Medication List as of 06/08/2017  3:17 PM    START taking these medications   Details  metroNIDAZOLE (METROGEL VAGINAL) 0.75 % vaginal gel Place 1 Applicatorful vaginally 2 (two) times daily., Starting Thu 06/08/2017, Print           Rayshawn Visconti, [place], PA-C 06/08/17 1601    Nat Christen, MD 06/10/17 1137

## 2017-06-08 NOTE — Discharge Instructions (Signed)
It is very important that you follow-up for repeat blood work in 48 hours. Do not hesitate to return to the emergency room at [facility] hospital or any close emergency room if you have worsening abdominal pain, vaginal bleeding, feel like you're going to pass out, feel short of breath or like your heart is racing quickly.  Do NOT take any NSAIDs, such as Aspirin, Motrin, Ibuprofen, Aleve, Naproxen etc. Only take Tylenol for pain. Return to the emergency room  for any severe abdominal pain, increasing vaginal bleeding, passing out or repeated vomiting.   Obtain over-the-counter prenatal vitamins. Read the label and make sure that they have at least 400 mcg of folate acid.

## 2017-06-09 LAB — GC/CHLAMYDIA PROBE AMP (~~LOC~~) NOT AT ARMC
Chlamydia: NEGATIVE
Neisseria Gonorrhea: NEGATIVE

## 2017-06-09 LAB — RPR: RPR: NONREACTIVE

## 2017-06-09 LAB — HIV ANTIBODY (ROUTINE TESTING W REFLEX): HIV Screen 4th Generation wRfx: NONREACTIVE

## 2017-06-10 ENCOUNTER — Inpatient Hospital Stay (HOSPITAL_COMMUNITY)
Admission: AD | Admit: 2017-06-10 | Discharge: 2017-06-10 | Disposition: A | Discharge status: Home / Self Care | Payer: Commercial Managed Care - PPO | Source: Ambulatory Visit | Attending: Obstetrics and Gynecology | Admitting: Obstetrics and Gynecology

## 2017-06-10 ENCOUNTER — Encounter (HOSPITAL_COMMUNITY): Payer: Self-pay

## 2017-06-10 DIAGNOSIS — R1030 Lower abdominal pain, unspecified: Secondary | ICD-10-CM | POA: Insufficient documentation

## 2017-06-10 DIAGNOSIS — O26891 Other specified pregnancy related conditions, first trimester: Secondary | ICD-10-CM | POA: Insufficient documentation

## 2017-06-10 DIAGNOSIS — R109 Unspecified abdominal pain: Secondary | ICD-10-CM | POA: Diagnosis not present

## 2017-06-10 DIAGNOSIS — Z3A01 Less than 8 weeks gestation of pregnancy: Secondary | ICD-10-CM | POA: Diagnosis not present

## 2017-06-10 LAB — HCG, QUANTITATIVE, PREGNANCY: HCG, BETA CHAIN, QUANT, S: 1104 m[IU]/mL — AB (ref <5)

## 2017-06-10 NOTE — MAU Note (Signed)
Having back spasms for one week was seen at [facility] ED two days, positive pregnancy test was told to follow up here for repeat labs.  Denies vaginal bleeding.

## 2017-06-10 NOTE — Discharge Instructions (Signed)
First Trimester of Pregnancy The first trimester of pregnancy is from week 1 until the end of week 13 (months 1 through 3). A week after a sperm fertilizes an egg, the egg will implant on the wall of the uterus. This embryo will begin to develop into a baby. Genes from you and your partner will form the baby. The female genes will determine whether the baby will be a boy or a girl. At 6-8 weeks, the eyes and face will be formed, and the heartbeat can be seen on ultrasound. At the end of 12 weeks, all the baby's organs will be formed. Now that you are pregnant, you will want to do everything you can to have a healthy baby. Two of the most important things are to get good prenatal care and to follow your health care provider's instructions. Prenatal care is all the medical care you receive before the baby's birth. This care will help prevent, find, and treat any problems during the pregnancy and childbirth. Body changes during your first trimester Your body goes through many changes during pregnancy. The changes vary from woman to woman.  You may gain or lose a couple of pounds at first.  You may feel sick to your stomach (nauseous) and you may throw up (vomit). If the vomiting is uncontrollable, call your health care provider.  You may tire easily.  You may develop headaches that can be relieved by medicines. All medicines should be approved by your health care provider.  You may urinate more often. Painful urination may mean you have a bladder infection.  You may develop heartburn as a result of your pregnancy.  You may develop constipation because certain hormones are causing the muscles that push stool through your intestines to slow down.  You may develop hemorrhoids or swollen veins (varicose veins).  Your breasts may begin to grow larger and become tender. Your nipples may stick out more, and the tissue that surrounds them (areola) may become darker.  Your gums may bleed and may be  sensitive to brushing and flossing.  Dark spots or blotches (chloasma, mask of pregnancy) may develop on your face. This will likely fade after the baby is born.  Your menstrual periods will stop.  You may have a loss of appetite.  You may develop cravings for certain kinds of food.  You may have changes in your emotions from day to day, such as being excited to be pregnant or being concerned that something may go wrong with the pregnancy and baby.  You may have more vivid and strange dreams.  You may have changes in your hair. These can include thickening of your hair, rapid growth, and changes in texture. Some women also have hair loss during or after pregnancy, or hair that feels dry or thin. Your hair will most likely return to normal after your baby is born.  What to expect at prenatal visits During a routine prenatal visit:  You will be weighed to make sure you and the baby are growing normally.  Your blood pressure will be taken.  Your abdomen will be measured to track your baby's growth.  The fetal heartbeat will be listened to between weeks 10 and 14 of your pregnancy.  Test results from any previous visits will be discussed.  Your health care provider may ask you:  How you are feeling.  If you are feeling the baby move.  If you have had any abnormal symptoms, such as leaking fluid, bleeding, severe headaches,   or abdominal cramping.  If you are using any tobacco products, including cigarettes, chewing tobacco, and electronic cigarettes.  If you have any questions.  Other tests that may be performed during your first trimester include:  Blood tests to find your blood type and to check for the presence of any previous infections. The tests will also be used to check for low iron levels (anemia) and protein on red blood cells (Rh antibodies). Depending on your risk factors, or if you previously had diabetes during pregnancy, you may have tests to check for high blood  sugar that affects pregnant women (gestational diabetes).  Urine tests to check for infections, diabetes, or protein in the urine.  An ultrasound to confirm the proper growth and development of the baby.  Fetal screens for spinal cord problems (spina bifida) and Down syndrome.  HIV (human immunodeficiency virus) testing. Routine prenatal testing includes screening for HIV, unless you choose not to have this test.  You may need other tests to make sure you and the baby are doing well.  Follow these instructions at home: Medicines  Follow your health care provider's instructions regarding medicine use. Specific medicines may be either safe or unsafe to take during pregnancy.  Take a prenatal vitamin that contains at least 600 micrograms (mcg) of folic acid.  If you develop constipation, try taking a stool softener if your health care provider approves. Eating and drinking  Eat a balanced diet that includes fresh fruits and vegetables, whole grains, good sources of protein such as meat, eggs, or tofu, and low-fat dairy. Your health care provider will help you determine the amount of weight gain that is right for you.  Avoid raw meat and uncooked cheese. These carry germs that can cause birth defects in the baby.  Eating four or five small meals rather than three large meals a day may help relieve nausea and vomiting. If you start to feel nauseous, eating a few soda crackers can be helpful. Drinking liquids between meals, instead of during meals, also seems to help ease nausea and vomiting.  Limit foods that are high in fat and processed sugars, such as fried and sweet foods.  To prevent constipation: ? Eat foods that are high in fiber, such as fresh fruits and vegetables, whole grains, and beans. ? Drink enough fluid to keep your urine clear or pale yellow. Activity  Exercise only as directed by your health care provider. Most women can continue their usual exercise routine during  pregnancy. Try to exercise for 30 minutes at least 5 days a week. Exercising will help you: ? Control your weight. ? Stay in shape. ? Be prepared for labor and delivery.  Experiencing pain or cramping in the lower abdomen or lower back is a good sign that you should stop exercising. Check with your health care provider before continuing with normal exercises.  Try to avoid standing for long periods of time. Move your legs often if you must stand in one place for a long time.  Avoid heavy lifting.  Wear low-heeled shoes and practice good posture.  You may continue to have sex unless your health care provider tells you not to. Relieving pain and discomfort  Wear a good support bra to relieve breast tenderness.  Take warm sitz baths to soothe any pain or discomfort caused by hemorrhoids. Use hemorrhoid cream if your health care provider approves.  Rest with your legs elevated if you have leg cramps or low back pain.  If you develop   varicose veins in your legs, wear support hose. Elevate your feet for 15 minutes, 3-4 times a day. Limit salt in your diet. Prenatal care  Schedule your prenatal visits by the twelfth week of pregnancy. They are usually scheduled monthly at first, then more often in the last 2 months before delivery.  Write down your questions. Take them to your prenatal visits.  Keep all your prenatal visits as told by your health care provider. This is important. Safety  Wear your seat belt at all times when driving.  Make a list of emergency phone numbers, including numbers for family, friends, the hospital, and police and fire departments. General instructions  Ask your health care provider for a referral to a local prenatal education class. Begin classes no later than the beginning of month 6 of your pregnancy.  Ask for help if you have counseling or nutritional needs during pregnancy. Your health care provider can offer advice or refer you to specialists for help  with various needs.  Do not use hot tubs, steam rooms, or saunas.  Do not douche or use tampons or scented sanitary pads.  Do not cross your legs for long periods of time.  Avoid cat litter boxes and soil used by cats. These carry germs that can cause birth defects in the baby and possibly loss of the fetus by miscarriage or stillbirth.  Avoid all smoking, herbs, alcohol, and medicines not prescribed by your health care provider. Chemicals in these products affect the formation and growth of the baby.  Do not use any products that contain nicotine or tobacco, such as cigarettes and e-cigarettes. If you need help quitting, ask your health care provider. You may receive counseling support and other resources to help you quit.  Schedule a dentist appointment. At home, brush your teeth with a soft toothbrush and be gentle when you floss. Contact a health care provider if:  You have dizziness.  You have mild pelvic cramps, pelvic pressure, or nagging pain in the abdominal area.  You have persistent nausea, vomiting, or diarrhea.  You have a bad smelling vaginal discharge.  You have pain when you urinate.  You notice increased swelling in your face, hands, legs, or ankles.  You are exposed to fifth disease or chickenpox.  You are exposed to German measles (rubella) and have never had it. Get help right away if:  You have a fever.  You are leaking fluid from your vagina.  You have spotting or bleeding from your vagina.  You have severe abdominal cramping or pain.  You have rapid weight gain or loss.  You vomit blood or material that looks like coffee grounds.  You develop a severe headache.  You have shortness of breath.  You have any kind of trauma, such as from a fall or a car accident. Summary  The first trimester of pregnancy is from week 1 until the end of week 13 (months 1 through 3).  Your body goes through many changes during pregnancy. The changes vary from  woman to woman.  You will have routine prenatal visits. During those visits, your health care provider will examine you, discuss any test results you may have, and talk with you about how you are feeling. This information is not intended to replace advice given to you by your health care provider. Make sure you discuss any questions you have with your health care provider. Document Released: 08/02/2001 Document Revised: 07/20/2016 Document Reviewed: 07/20/2016 [employer] Interactive Patient Education  2017 [employer]  

## 2017-06-10 NOTE — MAU Provider Note (Signed)
History   Chief Complaint:  Follow-up and Back Pain   April Riddle is  26 y.o. G1P0000 Patient's last menstrual period was 05/02/2017..   Was seen in ED for abdominal cramping.  Patient is here for ongoing lower abdominal cramping, mild .   She is [redacted]w[redacted]d weeks gestation  by LMP, early ultrasound.    Since her last visit, the patient is withoutnew complaint.  Pain is better.  The patient reports bleeding as  none now.    General ROS:  positive lower abdominal cramping. Otherwise negative  Her previous Quantitative HCG values are:  Ref. Range 06/08/2017 12:59  HCG, Beta Chain, Quant, S Latest Ref Range: <5 mIU/mL 476 (H)       Physical Exam   Blood pressure (!) 114/52, pulse 83, temperature 98.4 F (36.9 C), resp. rate 16, weight 171 lb 1.3 oz (77.6 kg), last menstrual period 05/02/2017.  Focused Gynecological Exam: normal external genitalia, vulva, vagina, cervix, uterus and adnexa  Labs: Results for orders placed or performed during the hospital encounter of 06/10/17 (from the past 24 hour(s))  hCG, quantitative, pregnancy     Status: Abnormal   Collection Time: 06/10/17  2:35 PM  Result Value Ref Range   hCG, Beta Chain, Quant, S 1,104 (H) <5 mIU/mL     Ultrasound Studies:   US Ob Comp Less 14 Wks  Result Date: 06/08/2017 CLINICAL DATA:  Low pelvic pain, late with menstrual cycle, positive pregnancy test indicating early pregnancy EXAM: OBSTETRIC <14 WK [place] AND TRANSVAGINAL OB US TECHNIQUE: Both transabdominal and transvaginal ultrasound examinations were performed for complete evaluation of the gestation as well as the maternal uterus, adnexal regions, and pelvic cul-de-sac. Transvaginal technique was performed to assess early pregnancy. COMPARISON:  None. FINDINGS: Intrauterine gestational sac: Not visualized Subchorionic hemorrhage:  None visualized. Maternal uterus/adnexae: Prominent endometrium is identified although no gestational sac is seen. Left ovary is not  visualized due to overlying bowel gas. A cystic lesion is noted within the right ovary measuring approximately 14 mm. Minimal free pelvic fluid is noted which may be physiologic in nature. IMPRESSION: No evidence of intrauterine gestational sac despite an early positive pregnancy test. Correlation with beta HCG levels and clinical symptomatology is recommended. Follow-up examination can be performed as clinically necessary. Electronically Signed   By: Inez Catalina M.D.   On: 06/08/2017 14:24   US Ob Transvaginal  Result Date: 06/08/2017 CLINICAL DATA:  Low pelvic pain, late with menstrual cycle, positive pregnancy test indicating early pregnancy EXAM: OBSTETRIC <14 WK [place] AND TRANSVAGINAL OB US TECHNIQUE: Both transabdominal and transvaginal ultrasound examinations were performed for complete evaluation of the gestation as well as the maternal uterus, adnexal regions, and pelvic cul-de-sac. Transvaginal technique was performed to assess early pregnancy. COMPARISON:  None. FINDINGS: Intrauterine gestational sac: Not visualized Subchorionic hemorrhage:  None visualized. Maternal uterus/adnexae: Prominent endometrium is identified although no gestational sac is seen. Left ovary is not visualized due to overlying bowel gas. A cystic lesion is noted within the right ovary measuring approximately 14 mm. Minimal free pelvic fluid is noted which may be physiologic in nature. IMPRESSION: No evidence of intrauterine gestational sac despite an early positive pregnancy test. Correlation with beta HCG levels and clinical symptomatology is recommended. Follow-up examination can be performed as clinically necessary. Electronically Signed   By: Inez Catalina M.D.   On: 06/08/2017 14:24     Assessment:  [redacted]w[redacted]d weeks gestation  Appropriate doubling of HCG levels   Plan: The patient is instructed  to follow up in in 1 weeks  [place] will be scheduled in about a week Ectopic precautions Encouraged to return here or to other  Urgent Care/ED if she develops worsening of symptoms, increase in pain, fever, or other concerning symptoms.    Hansel Feinstein 06/10/2017, 3:00 PM

## 2017-06-22 ENCOUNTER — Ambulatory Visit (HOSPITAL_COMMUNITY)
Admission: RE | Admit: 2017-06-22 | Discharge: 2017-06-22 | Disposition: A | Discharge status: Home / Self Care | Payer: Commercial Managed Care - PPO | Source: Ambulatory Visit | Attending: Advanced Practice Midwife | Admitting: Advanced Practice Midwife

## 2017-06-22 ENCOUNTER — Ambulatory Visit: Payer: Commercial Managed Care - PPO | Admitting: *Deleted

## 2017-06-22 DIAGNOSIS — Z3A01 Less than 8 weeks gestation of pregnancy: Secondary | ICD-10-CM | POA: Diagnosis not present

## 2017-06-22 DIAGNOSIS — O3411 Maternal care for benign tumor of corpus uteri, first trimester: Secondary | ICD-10-CM | POA: Diagnosis not present

## 2017-06-22 DIAGNOSIS — D259 Leiomyoma of uterus, unspecified: Secondary | ICD-10-CM | POA: Diagnosis not present

## 2017-06-22 DIAGNOSIS — R109 Unspecified abdominal pain: Secondary | ICD-10-CM

## 2017-06-22 DIAGNOSIS — O26891 Other specified pregnancy related conditions, first trimester: Secondary | ICD-10-CM | POA: Diagnosis present

## 2017-06-22 DIAGNOSIS — Z712 Person consulting for explanation of examination or test findings: Secondary | ICD-10-CM

## 2017-06-22 NOTE — Progress Notes (Signed)
Patient presented to clinic for u/s results. U/s reviewed with Maye Hides, CNM. Viable pregnancy, recommended starting prenatal care. Results and recommendation to patient, understanding voiced. Does not plan to get care in this office. C/o back spasms at night when laying down, would like something for this. Stated the same thing happened when she was pregnant before, is using heat and borrowed diclofenac cream, but it only helps for a short time. Consulted with [facility], who recommended otc lidocaine patches, make appt to start care. I also recommended chiropractic as an option for pain relief. Patient voiced understanding.

## 2017-06-22 NOTE — Progress Notes (Signed)
Chart reviewed for nurse visit. Agree with plan of care.   Starr Lake, [place] 06/22/2017 5:07 PM

## 2017-06-22 NOTE — Progress Notes (Signed)
Chart reviewed for nurse visit. Agree with plan of care.   Starr Lake, [place] 06/22/2017 5:07 PM

## 2017-07-25 ENCOUNTER — Encounter: Payer: Self-pay | Admitting: Medical

## 2017-07-25 ENCOUNTER — Ambulatory Visit (INDEPENDENT_AMBULATORY_CARE_PROVIDER_SITE_OTHER): Payer: Commercial Managed Care - PPO | Admitting: Medical

## 2017-07-25 VITALS — BP 103/61 | HR 62 | Ht 67.0 in | Wt 172.5 lb

## 2017-07-25 DIAGNOSIS — Z332 Encounter for elective termination of pregnancy: Secondary | ICD-10-CM

## 2017-07-25 DIAGNOSIS — Z6791 Unspecified blood type, Rh negative: Secondary | ICD-10-CM

## 2017-07-25 MED ORDER — RHO D IMMUNE GLOBULIN 1500 UNIT/2ML IJ SOSY
300.0000 ug | PREFILLED_SYRINGE | Freq: Once | INTRAMUSCULAR | Status: AC
  Administered 2017-07-25: 300 ug via INTRAMUSCULAR

## 2017-07-25 NOTE — Patient Instructions (Signed)
Dilation and Curettage or Vacuum Curettage, Care After  These instructions give you information about caring for yourself after your procedure. Your doctor may also give you more specific instructions. Call your doctor if you have any problems or questions after your procedure.  Follow these instructions at home:  Activity   · Do not drive or use heavy machinery while taking prescription pain medicine.  · For 24 hours after your procedure, avoid driving.  · Take short walks often, followed by rest periods. Ask your doctor what activities are safe for you. After one or two days, you may be able to return to your normal activities.  · Do not lift anything that is heavier than 10 lb (4.5 kg) until your doctor approves.  · For at least 2 weeks, or as long as told by your doctor:  ? Do not douche.  ? Do not use tampons.  ? Do not have sex.  General instructions   · Take over-the-counter and prescription medicines only as told by your doctor. This is very important if you take blood thinning medicine.  · Do not take baths, swim, or use a hot tub until your doctor approves. Take showers instead of baths.  · Wear compression stockings as told by your doctor.  · It is up to you to get the results of your procedure. Ask your doctor when your results will be ready.  · Keep all follow-up visits as told by your doctor. This is important.  Contact a doctor if:  · You have very bad cramps that get worse or do not get better with medicine.  · You have very bad pain in your belly (abdomen).  · You cannot drink fluids without throwing up (vomiting).  · You get pain in a different part of the area between your belly and thighs (pelvis).  · You have bad-smelling discharge from your vagina.  · You have a rash.  Get help right away if:  · You are bleeding a lot from your vagina. A lot of bleeding means soaking more than one sanitary pad in an hour, for 2 hours in a row.  · You have clumps of blood (blood clots) coming from your  vagina.  · You have a fever or chills.  · Your belly feels very tender or hard.  · You have chest pain.  · You have trouble breathing.  · You cough up blood.  · You feel dizzy.  · You feel light-headed.  · You pass out (faint).  · You have pain in your neck or shoulder area.  Summary  · Take short walks often, followed by rest periods. Ask your doctor what activities are safe for you. After one or two days, you may be able to return to your normal activities.  · Do not lift anything that is heavier than 10 lb (4.5 kg) until your doctor approves.  · Do not take baths, swim, or use a hot tub until your doctor approves. Take showers instead of baths.  · Contact your doctor if you have any symptoms of infection, like bad-smelling discharge from your vagina.  This information is not intended to replace advice given to you by your health care provider. Make sure you discuss any questions you have with your health care provider.  Document Released: 05/17/2008 Document Revised: 04/25/2016 Document Reviewed: 04/25/2016  Elsevier Interactive Patient Education © 2017 [employer]

## 2017-07-25 NOTE — Progress Notes (Signed)
  History:  Ms. April Riddle is a 26 y.o. G1P0000 who presents to clinic today for follow-up after TAB at the "clinic" on [address] performed 1 weeks ago on 07/18/17. The patient was [redacted] weeks GA at that time. She had confirmed IUP here earlier in the pregnancy. She denies pain today. She is having some bleeding and using 3-4 pads/day. She denies fever. She states that she was told by this clinic that she had a negative blood type and she needed Rhogam within 72 hours of the procedure. She chose not to follow-up until today. She does not have any documentation of her blood type with her today.   The following portions of the patient's history were reviewed and updated as appropriate: allergies, current medications, family history, past medical history, social history, past surgical history and problem list.  Review of Systems:  Review of Systems  Constitutional: Negative for chills and fever.  Gastrointestinal: Negative for abdominal pain.  Genitourinary:       + vaginal bleeding  Neurological: Positive for headaches.      Objective:  Physical Exam BP 103/61   Pulse 62   Ht 5\' 7"  (1.702 m)   Wt 172 lb 8 oz (78.2 kg)   LMP 05/09/2017 (Exact Date)   Breastfeeding? Unknown   BMI 27.02 kg/m  Physical Exam  Constitutional: She is oriented to person, place, and time. She appears well-developed and well-nourished. No distress.  HENT:  Head: Normocephalic.  Cardiovascular: Normal rate.  Pulmonary/Chest: Effort normal.  Abdominal: Soft.  Neurological: She is alert and oriented to person, place, and time.  Skin: Skin is warm and dry. No erythema.  Psychiatric: She has a normal mood and affect.  Vitals reviewed.   Assessment & Plan:  1. Blood type, Rh negative - rho (d) immune globulin (RHIG/RHOPHYLAC) injection 300 mcg  2. Abortion in first trimester - CBC - ABO/Rh - Patient states that she was given OCPs by the clinic that did the abortion. Discussed instructions for taking  OCPs and need for back-up method of birth control x 1 month - Patient may return to [place] as needed   Danielle Rankin 07/25/2017 2:31 PM

## 2017-07-26 LAB — CBC
HEMATOCRIT: 30.5 % — AB (ref 34.0–46.6)
Hemoglobin: 9.9 g/dL — ABNORMAL LOW (ref 11.1–15.9)
MCH: 25.6 pg — ABNORMAL LOW (ref 26.6–33.0)
MCHC: 32.5 g/dL (ref 31.5–35.7)
MCV: 79 fL (ref 79–97)
PLATELETS: 294 10*3/uL (ref 150–379)
RBC: 3.87 x10E6/uL (ref 3.77–5.28)
RDW: 16.3 % — AB (ref 12.3–15.4)
WBC: 6.5 10*3/uL (ref 3.4–10.8)

## 2017-09-05 ENCOUNTER — Ambulatory Visit (INDEPENDENT_AMBULATORY_CARE_PROVIDER_SITE_OTHER): Payer: Commercial Managed Care - PPO

## 2017-09-05 DIAGNOSIS — Z3202 Encounter for pregnancy test, result negative: Secondary | ICD-10-CM

## 2017-09-05 LAB — POCT PREGNANCY, URINE: Preg Test, Ur: NEGATIVE

## 2017-09-05 NOTE — Progress Notes (Signed)
Patient presented to the office for a UPT. UPT negative. Pt states that she took a home UPT 2 weeks ago and UPT was positive. Pt states that her period comes around the 20th of each month. Advised pt to take another test in two weeks and to call our office if she starts to have any bleeding or pain. Pt verbalized understanding.

## 2017-09-09 NOTE — Progress Notes (Signed)
I reviewed chart and was present in the office at time of visit. Agree with nursing note.   Almyra Free P. Degele, MD OB Fellow

## 2018-01-10 IMAGING — US US OB COMP LESS 14 WK
1 series · 14 of 28 positions shown · non-contrast
Comparison: None.

CLINICAL DATA: Low pelvic pain, late with menstrual cycle, positive
pregnancy test indicating early pregnancy

EXAM:
OBSTETRIC <14 WK US AND TRANSVAGINAL OB US
TECHNIQUE: Both transabdominal and transvaginal ultrasound examinations were
performed for complete evaluation of the gestation as well as the
maternal uterus, adnexal regions, and pelvic cul-de-sac.
Transvaginal technique was performed to assess early pregnancy.

[Series 1: us ob comp less 14 wk · 0.23mm/px · 14 of 54 slices shown]
[im 2/54]
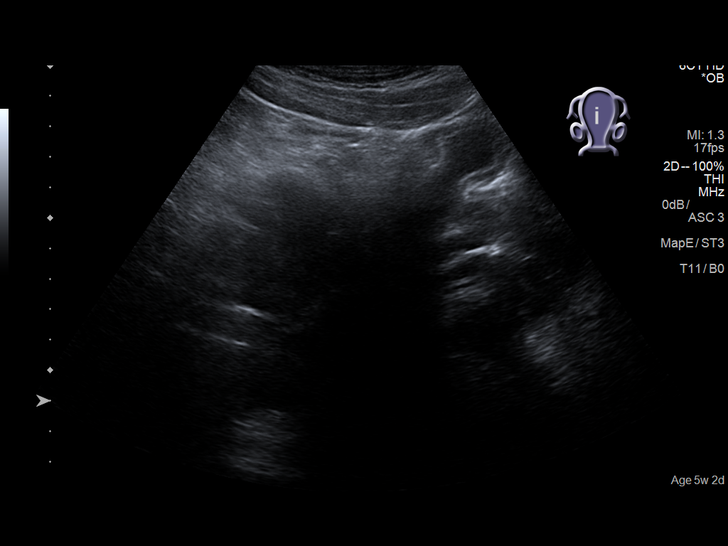
[im 6/54]
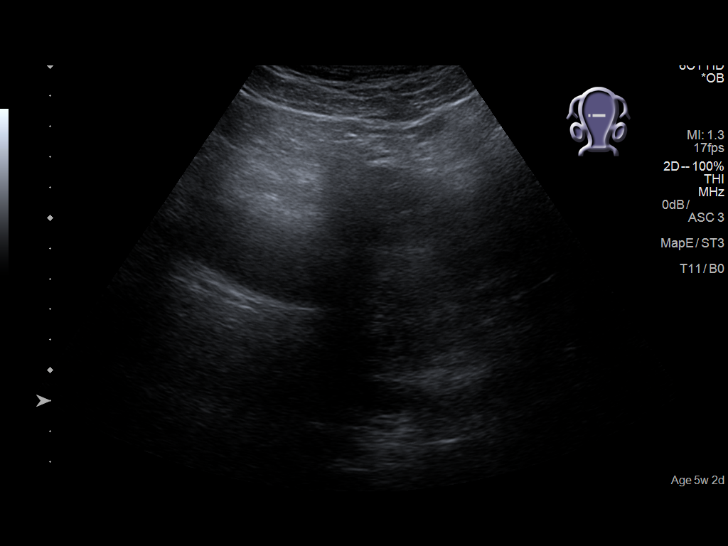
[im 10/54]
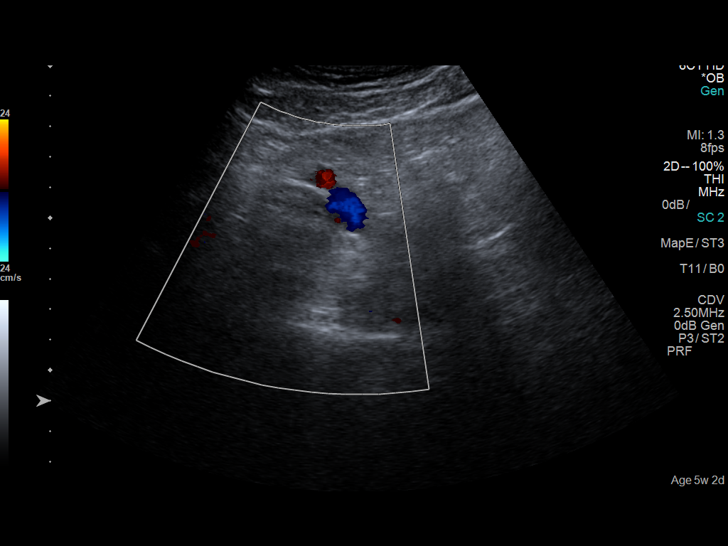
[im 14/54]
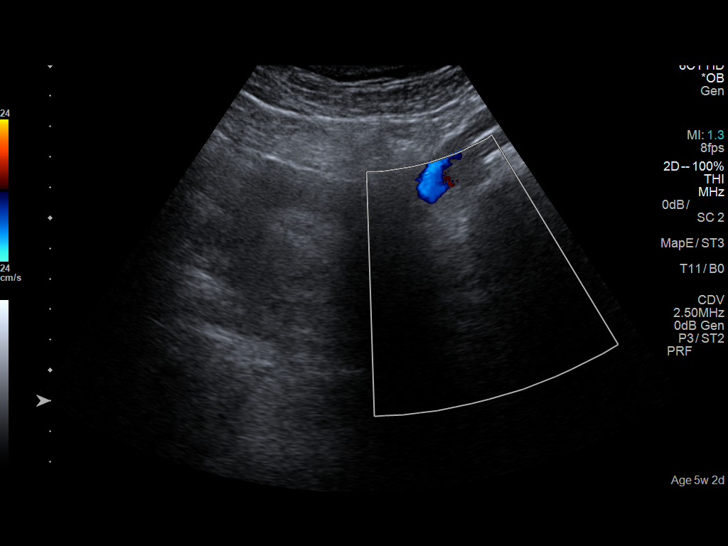
[im 18/54]
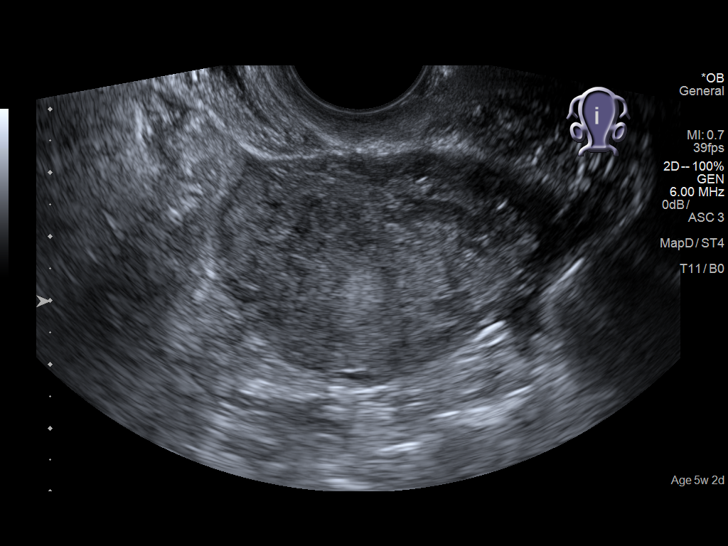
[im 22/54]
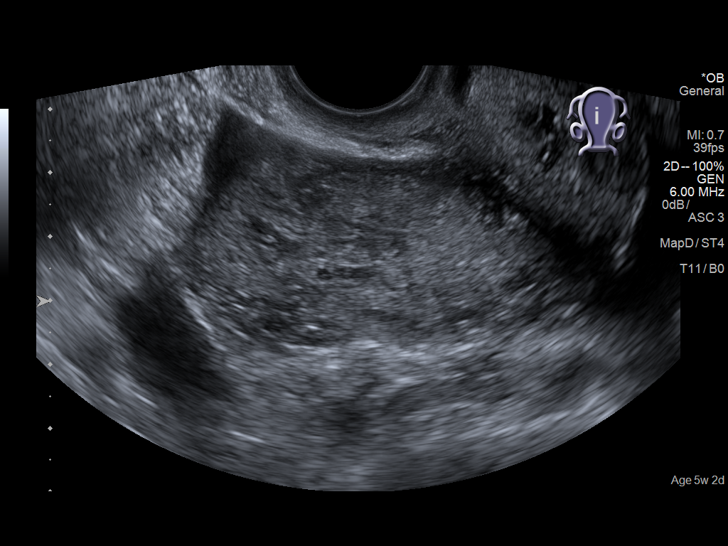
[im 26/54]
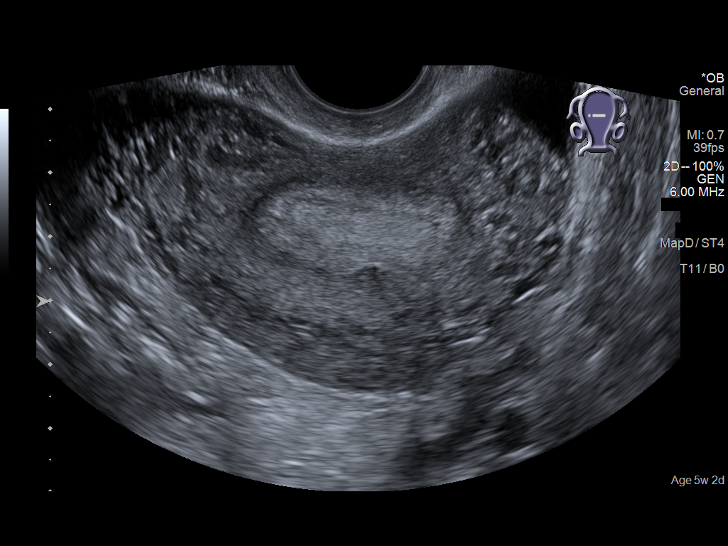
[im 30/54]
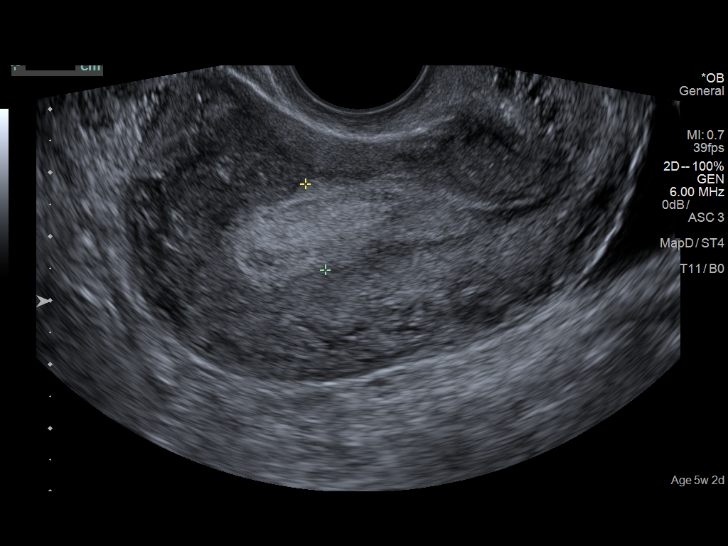
[im 34/54]
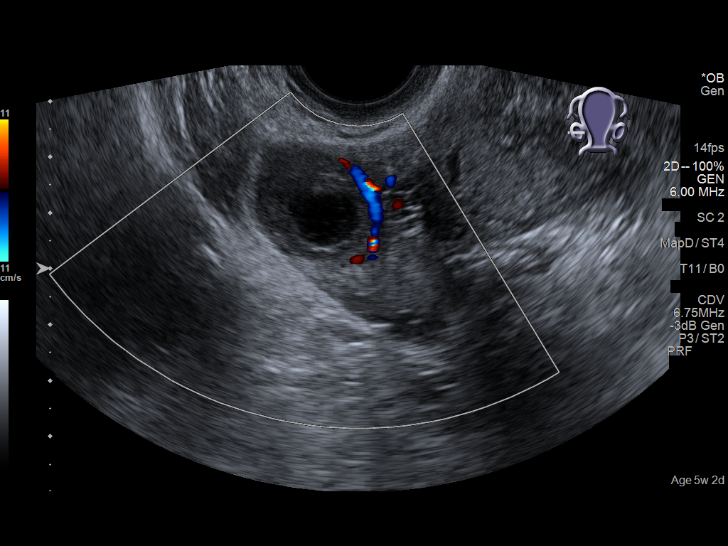
[im 38/54]
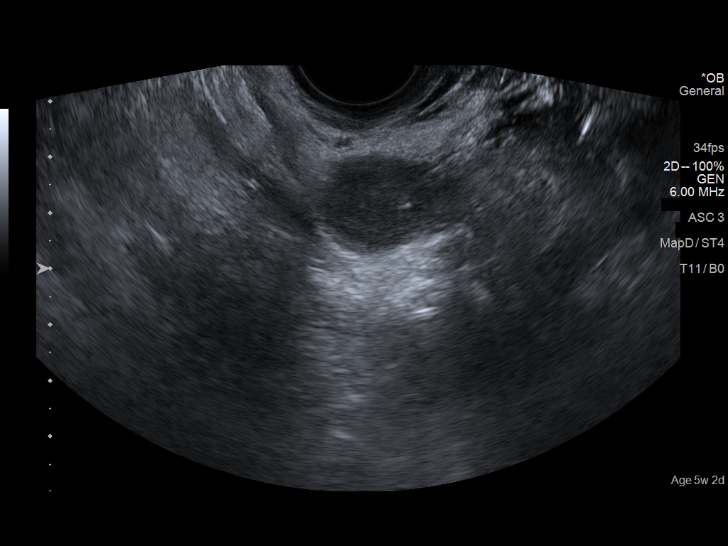
[im 42/54]
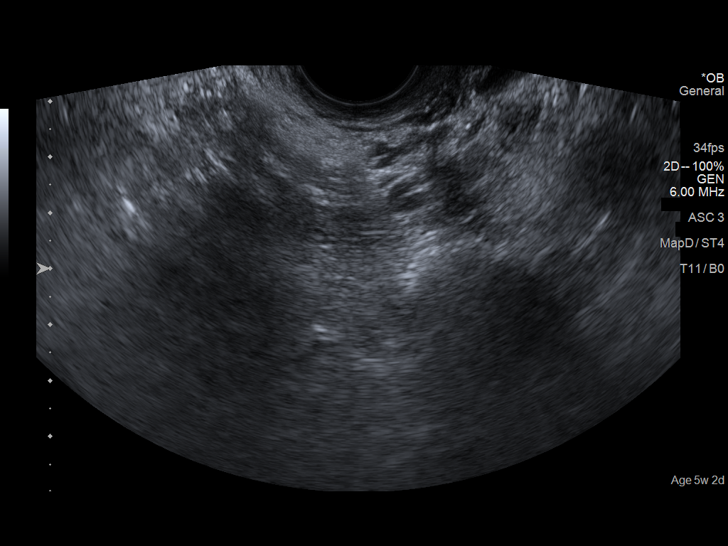
[im 46/54]
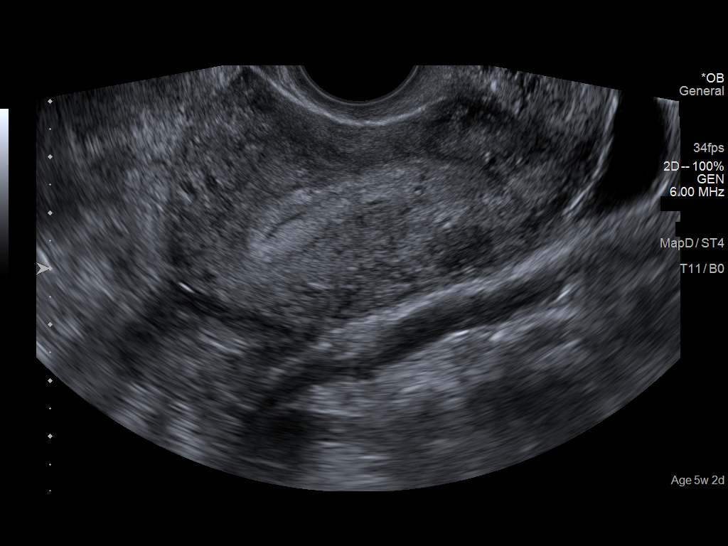
[im 50/54]
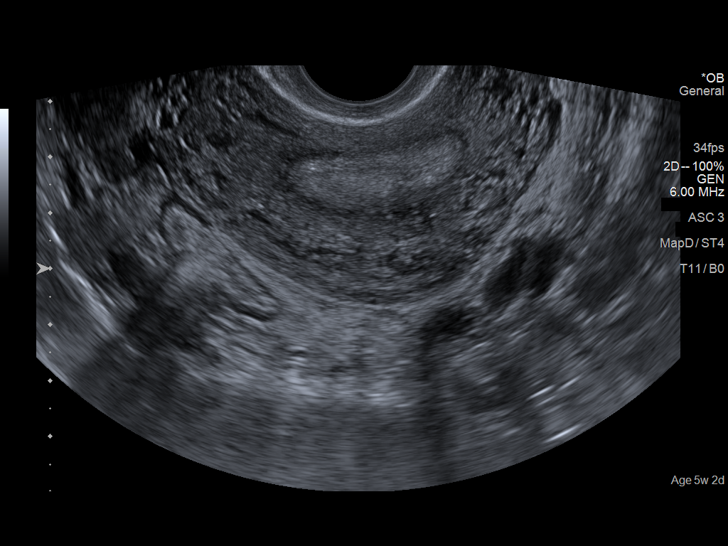
[im 54/54]
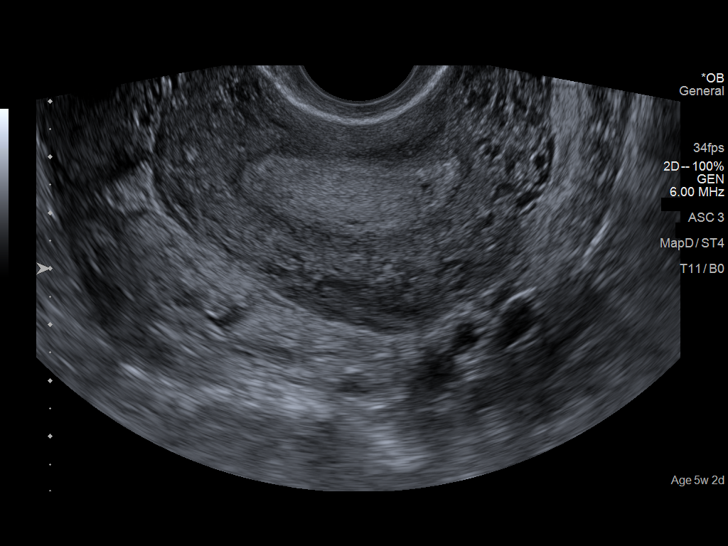

[14 of 28 positions shown; findings below may reference images not displayed]

FINDINGS: Intrauterine gestational sac: Not visualized

Subchorionic hemorrhage:  None visualized.

Maternal uterus/adnexae: Prominent endometrium is identified
although no gestational sac is seen. Left ovary is not visualized
due to overlying bowel gas. A cystic lesion is noted within the
right ovary measuring approximately 14 mm. Minimal free pelvic fluid
is noted which may be physiologic in nature.
IMPRESSION: No evidence of intrauterine gestational sac despite an early
positive pregnancy test. Correlation with beta HCG levels and
clinical symptomatology is recommended. Follow-up examination can be
performed as clinically necessary.

## 2018-05-16 ENCOUNTER — Ambulatory Visit (INDEPENDENT_AMBULATORY_CARE_PROVIDER_SITE_OTHER): Payer: Commercial Managed Care - PPO | Admitting: General Practice

## 2018-05-16 DIAGNOSIS — Z3202 Encounter for pregnancy test, result negative: Secondary | ICD-10-CM | POA: Diagnosis not present

## 2018-05-16 LAB — POCT PREGNANCY, URINE: Preg Test, Ur: NEGATIVE

## 2018-05-16 NOTE — Progress Notes (Signed)
Patient presents to office today for UPT. UPT -. Patient has not taken any pregnancy tests at home. Patient states she always has a period at the end of the month around the same day. Patient reports LMP end of August and hasn't had one for September yet. Patient states she has also just felt weird recently. Patient is not using birth control & isn't on any medication. Discussed with patient it can be normal for periods to be a little late some months and she should return to the office for follow up if she doesn't have a period by the end of October. Patient verbalized understanding & declines offer for contraceptive counseling at this time.

## 2018-05-18 NOTE — Progress Notes (Signed)
I have reviewed the chart and agree with nursing staff's documentation of this patient's encounter.  Verita Schneiders, MD 05/18/2018 8:09 AM

## 2018-07-28 ENCOUNTER — Encounter (HOSPITAL_COMMUNITY): Payer: Self-pay | Admitting: *Deleted

## 2018-07-28 ENCOUNTER — Other Ambulatory Visit: Payer: Self-pay

## 2018-07-28 ENCOUNTER — Emergency Department (HOSPITAL_COMMUNITY): Payer: Commercial Managed Care - PPO

## 2018-07-28 ENCOUNTER — Emergency Department (HOSPITAL_COMMUNITY)
Admission: EM | Admit: 2018-07-28 | Discharge: 2018-07-28 | Disposition: A | Discharge status: Home / Self Care | Payer: Commercial Managed Care - PPO | Attending: Emergency Medicine | Admitting: Emergency Medicine

## 2018-07-28 DIAGNOSIS — R51 Headache: Secondary | ICD-10-CM | POA: Diagnosis present

## 2018-07-28 DIAGNOSIS — Z79899 Other long term (current) drug therapy: Secondary | ICD-10-CM | POA: Insufficient documentation

## 2018-07-28 DIAGNOSIS — G43809 Other migraine, not intractable, without status migrainosus: Secondary | ICD-10-CM

## 2018-07-28 HISTORY — DX: Migraine, unspecified, not intractable, without status migrainosus: G43.909

## 2018-07-28 LAB — I-STAT BETA HCG BLOOD, ED (MC, WL, AP ONLY): I-stat hCG, quantitative: <5 m[IU]/mL (ref <5)

## 2018-07-28 LAB — I-STAT CHEM 8, ED
BUN: 9 mg/dL (ref 6–20)
CHLORIDE: 105 mmol/L (ref 98–111)
CREATININE: 0.6 mg/dL (ref 0.44–1.00)
Calcium, Ion: 1.15 mmol/L (ref 1.15–1.40)
Glucose, Bld: 97 mg/dL (ref 70–99)
HEMATOCRIT: 33 % — AB (ref 36.0–46.0)
Hemoglobin: 11.2 g/dL — ABNORMAL LOW (ref 12.0–15.0)
POTASSIUM: 3.5 mmol/L (ref 3.5–5.1)
Sodium: 138 mmol/L (ref 135–145)
TCO2: 24 mmol/L (ref 22–32)

## 2018-07-28 MED ORDER — SODIUM CHLORIDE 0.9 % IV BOLUS
1000.0000 mL | Freq: Once | INTRAVENOUS | Status: AC
  Administered 2018-07-28: 1000 mL via INTRAVENOUS

## 2018-07-28 MED ORDER — PROCHLORPERAZINE EDISYLATE 10 MG/2ML IJ SOLN
10.0000 mg | Freq: Once | INTRAMUSCULAR | Status: AC
  Administered 2018-07-28: 10 mg via INTRAVENOUS
  Filled 2018-07-28: qty 2

## 2018-07-28 MED ORDER — KETOROLAC TROMETHAMINE 30 MG/ML IJ SOLN
30.0000 mg | Freq: Once | INTRAMUSCULAR | Status: AC
  Administered 2018-07-28: 30 mg via INTRAVENOUS
  Filled 2018-07-28: qty 1

## 2018-07-28 NOTE — ED Notes (Signed)
Pt verbalized discharge instructions and follow up care. Pt's friend is driving her home.

## 2018-07-28 NOTE — ED Notes (Signed)
Patient transported to CT 

## 2018-07-28 NOTE — ED Triage Notes (Signed)
Pt presents with migraines x 2.5 weeks.  Pt reports hx of migraines.  Pt has been medicating with Excedrin at home without much relief.  Pt reports n/v and and episode of dizziness today when she was at work. Pt last took Excedrin at 16:00 and prior to arrival to ED. Pt a/o x 4 and ambulatory in triage.

## 2018-07-31 NOTE — ED Provider Notes (Signed)
[facility] DEPT Provider Note   CSN: [id number] Arrival date & time: 07/28/18  1821     History   Chief Complaint Chief Complaint  Patient presents with  . Migraine    HPI April Riddle is a 27 y.o. female.  HPI Healthy 27 year old female with a history of migraine headaches presents the emergency department with worsening headache over the past 2 to 2-1/2 weeks.  She reports nausea and vomiting and today felt slightly dizzy.  She denies syncope.  No chest pain or palpitations.  No unilateral arm or leg weakness.  No recent head injury or trauma.  She tried Excedrin prior to arrival without improvement in her symptoms.  She has not seen a neurologist for follow-up in regards to her headache.   Past Medical History:  Diagnosis Date  . Anxiety   . Migraine   . Panic attack     Patient Active Problem List   Diagnosis Date Noted  . Panic attack     History reviewed. No pertinent surgical history.   OB History    Gravida  1   Para  0   Term  0   Preterm  0   AB  0   Living  0     SAB  0   TAB  0   Ectopic  0   Multiple  0   Live Births  0            Home Medications    Prior to Admission medications   Medication Sig Start Date End Date Taking? Authorizing Provider  aspirin-acetaminophen-caffeine (EXCEDRIN MIGRAINE) [phone] MG tablet Take 1-2 tablets by mouth every 6 (six) hours as needed for headache or migraine.   Yes [provider]  metroNIDAZOLE (METROGEL VAGINAL) 0.75 % vaginal gel Place 1 Applicatorful vaginally 2 (two) times daily. Patient not taking: Reported on 06/22/2017 06/08/17   Pisciotta, Charna Elizabeth    Family History No family history on file.  Social History Social History   Tobacco Use  . Smoking status: Never Smoker  . Smokeless tobacco: Never Used  Substance Use Topics  . Alcohol use: No  . Drug use: Yes    Types: Marijuana     Allergies   Amoxicillin   Review of  Systems Review of Systems  All other systems reviewed and are negative.    Physical Exam Updated Vital Signs BP (!) 98/55 (BP Location: Left Arm)   Pulse 83   Temp 98.3 F (36.8 C) (Oral)   Resp 18   Ht 5' 7.75" (1.721 m)   Wt 78.9 kg   LMP 07/18/2018 (Exact Date)   SpO2 100%   BMI 26.65 kg/m   Physical Exam  Constitutional: She is oriented to person, place, and time. She appears well-developed and well-nourished. No distress.  HENT:  Head: Normocephalic and atraumatic.  Eyes: Pupils are equal, round, and reactive to light. EOM are normal.  Neck: Normal range of motion.  Cardiovascular: Normal rate, regular rhythm and normal heart sounds.  Pulmonary/Chest: Effort normal and breath sounds normal.  Abdominal: Soft. She exhibits no distension. There is no tenderness.  Musculoskeletal: Normal range of motion.  Neurological: She is alert and oriented to person, place, and time.  5/5 strength in major muscle groups of  bilateral upper and lower extremities. Speech normal. No facial asymetry.   Skin: Skin is warm and dry.  Psychiatric: She has a normal mood and affect. Judgment normal.  Nursing note and vitals  reviewed.    ED Treatments / Results  Labs (all labs ordered are listed, but only abnormal results are displayed) Labs Reviewed  I-STAT CHEM 8, ED - Abnormal; Notable for the following components:      Result Value   Hemoglobin 11.2 (*)    HCT 33.0 (*)    All other components within normal limits  I-STAT BETA HCG BLOOD, ED ([facility], [facility], [facility] ONLY)    EKG None  Radiology No results found.  Procedures Procedures (including critical care time)  Medications Ordered in ED Medications  sodium chloride 0.9 % bolus 1,000 mL (0 mLs Intravenous Stopped 07/28/18 2113)  ketorolac (TORADOL) 30 MG/ML injection 30 mg (30 mg Intravenous Given 07/28/18 2014)  prochlorperazine (COMPAZINE) injection 10 mg (10 mg Intravenous Given 07/28/18 2012)     Initial Impression / Assessment  and Plan / ED Course  I have reviewed the triage vital signs and the nursing notes.  Pertinent labs & imaging results that were available during my care of the patient were reviewed by me and considered in my medical decision making (see chart for details).     Typical migraine.  Patient improved in the emergency department.  Patient requested CT imaging the head which was without acute pathology.  She will need outpatient neurology follow-up and possibly MRI as an outpatient.  No indication for additional work-up or admission the hospital at this time.  Patient stable for discharge.  All questions answered.  She understands to return to the emergency department for new or worsening symptoms  Final Clinical Impressions(s) / ED Diagnoses   Final diagnoses:  Other migraine without status migrainosus, not intractable    ED Discharge Orders    None       Jola Schmidt, MD 07/31/18 [phone]

## 2019-10-23 IMAGING — US US OB TRANSVAGINAL
1 series · 15 of 28 positions shown · non-contrast
Comparison: 06/08/2017

CLINICAL DATA: Pelvic pain. Pregnancy of unknown anatomic location.

EXAM:
TRANSVAGINAL OB ULTRASOUND
TECHNIQUE: Transvaginal ultrasound was performed for complete evaluation of the
gestation as well as the maternal uterus, adnexal regions, and
pelvic cul-de-sac.

[Series 1: us ob transvaginal · 59 acquisitions, 15 frames shown]
[im 1/59]
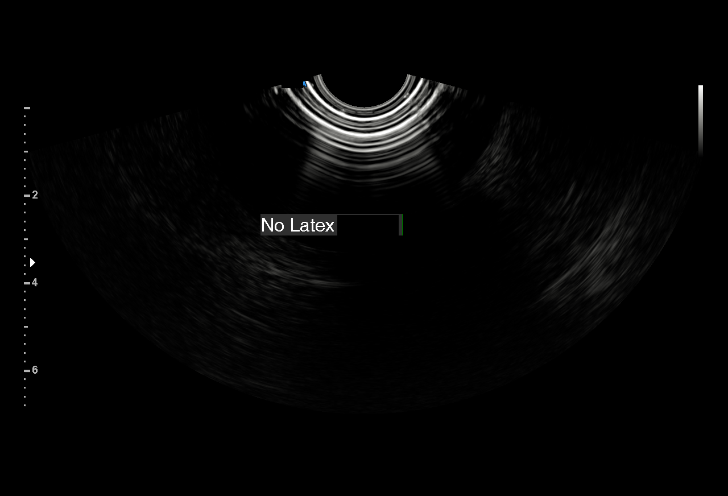
[im 5/59]
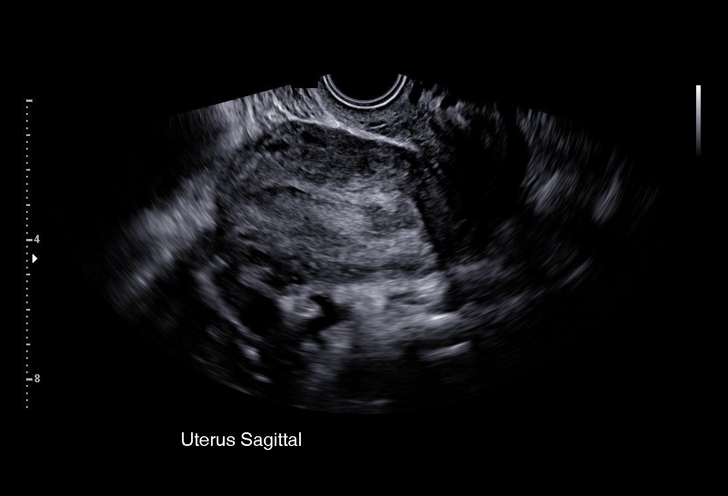
[im 9/59]
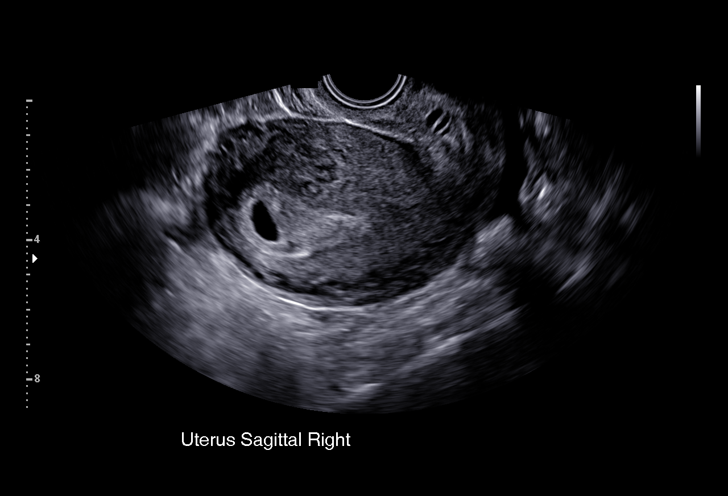
[im 13/59]
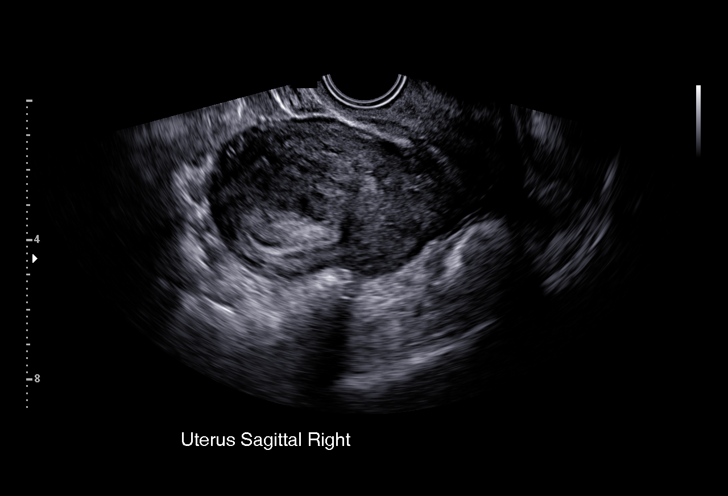
[im 18/59]
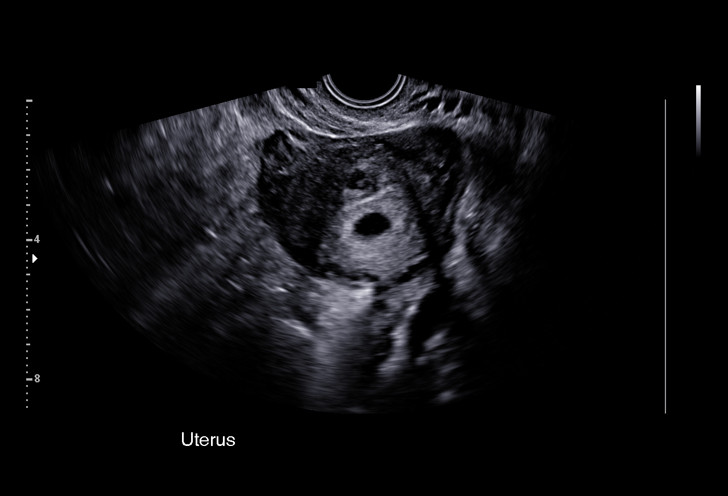
[im 22/59]
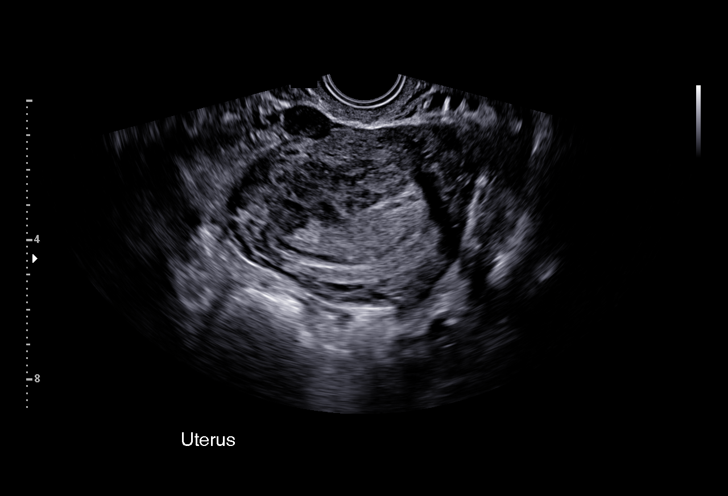
[im 26/59]
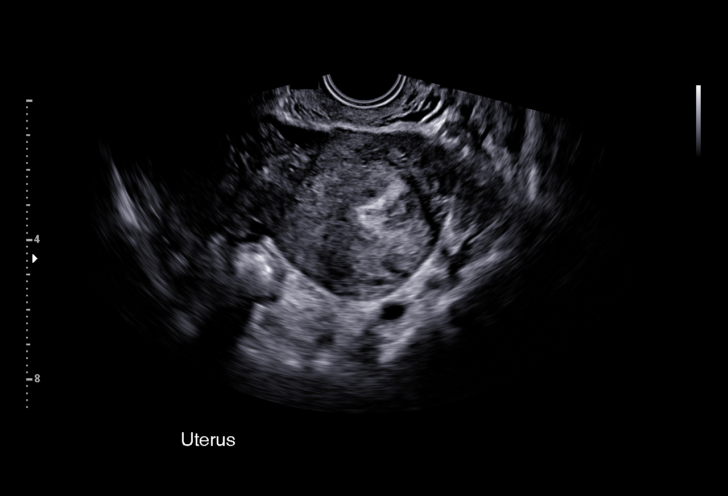
[im 31/59]
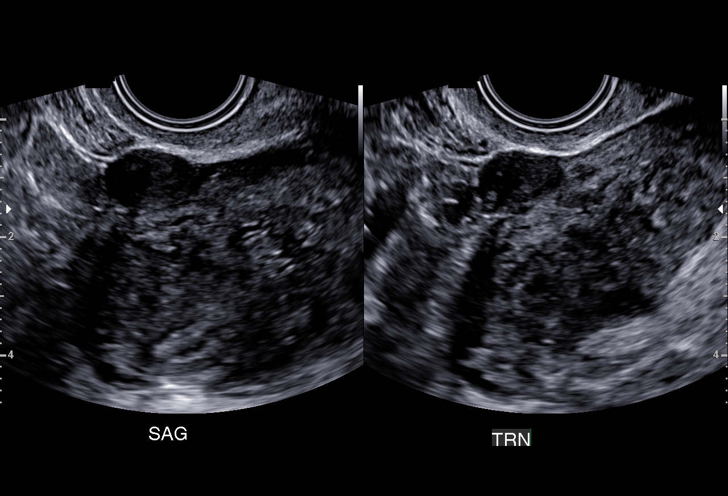
[im 33/59]
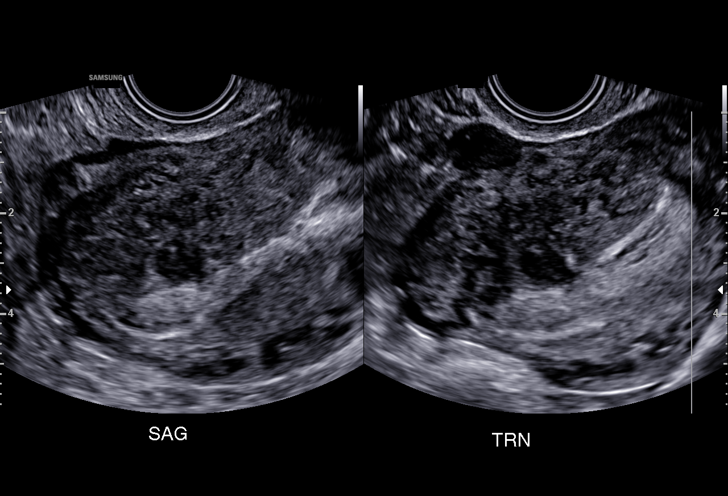
[im 37/59]
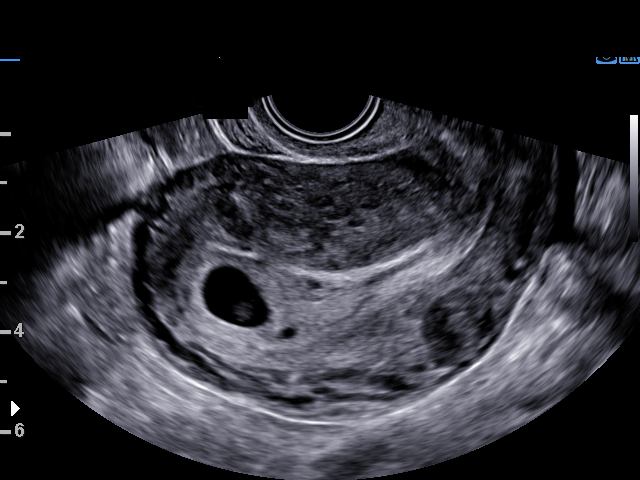
[im 41/59]
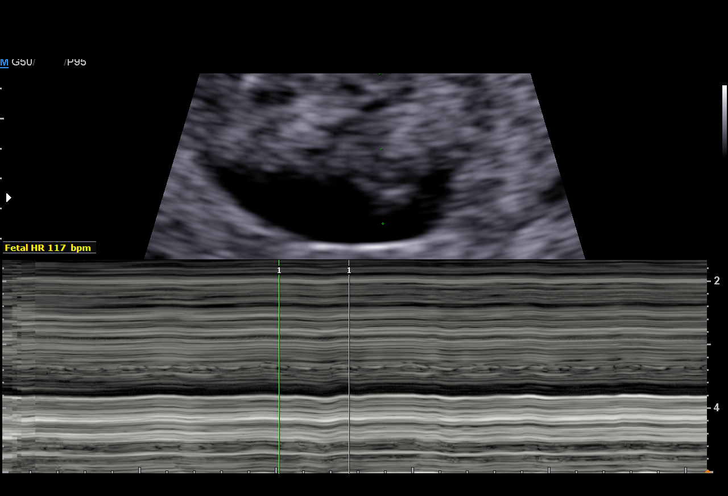
[im 46/59]
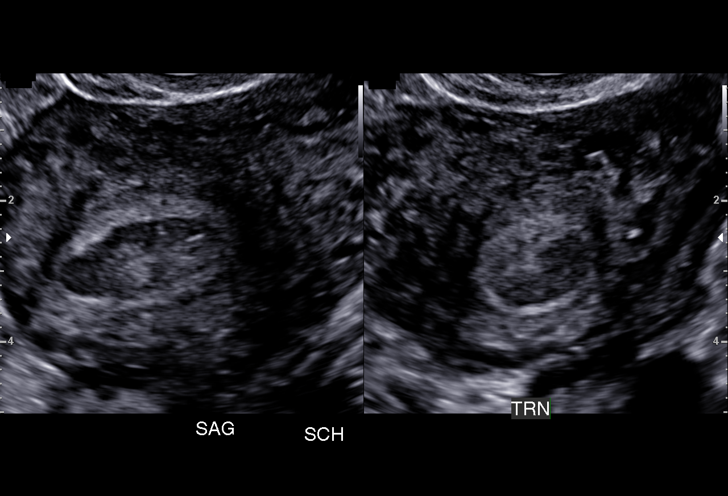
[im 50/59]
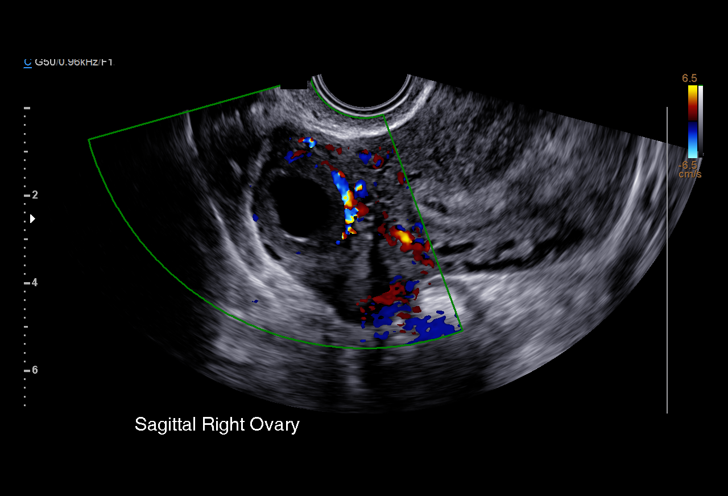
[im 54/59]
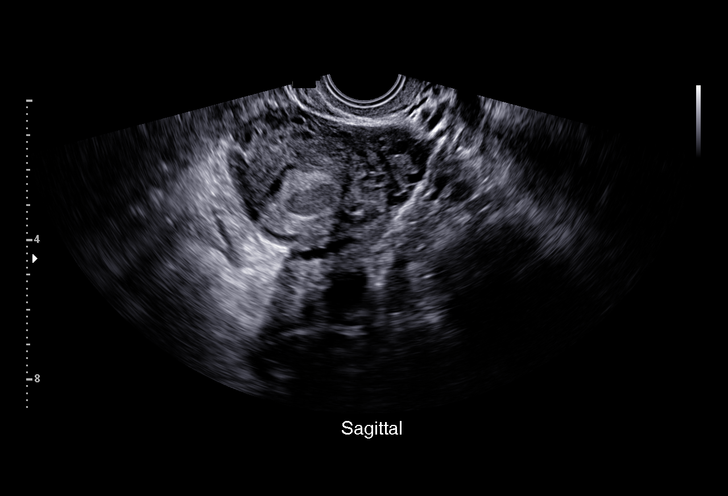
[im 59/59]
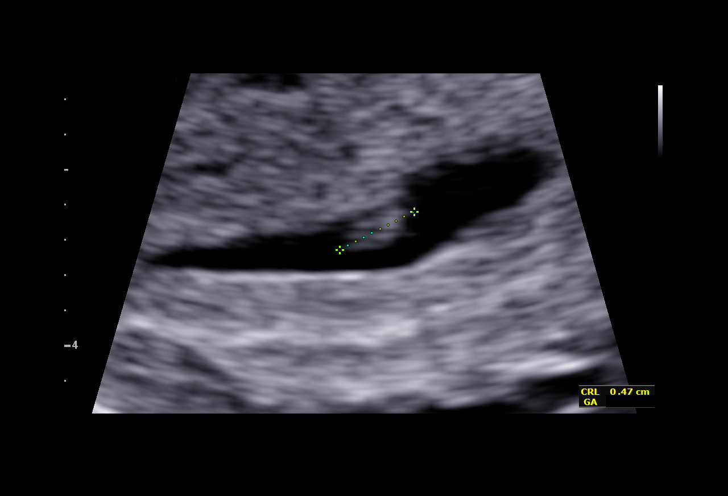

[15 of 28 positions shown; findings below may reference images not displayed]

FINDINGS: Intrauterine gestational sac: Single

Yolk sac:  Visualized.

Embryo:  Visualized.

Cardiac Activity: Visualized.

Heart Rate: 117 bpm

CRL:   5  mm   6 w 1 d                  US EDC: 02/14/2018

Subchorionic hemorrhage: Small subchorionic hemorrhage noted in
lower uterine segment. 3 small anterior and fundal uterine fibroids
are seen measuring 1.2 cm, 1.5 cm, and 1.7 cm.

Maternal uterus/adnexae: Small right ovarian corpus luteum noted.
Normal appearance of left ovary. No adnexal mass or abnormal free
fluid identified.
IMPRESSION: Single living IUP measuring 6 weeks 1 day, with US EDC of
02/14/2018.

Small subchorionic hemorrhage.

Small uterine fibroids measuring 1-2 cm.

## 2019-12-31 ENCOUNTER — Other Ambulatory Visit: Payer: Self-pay

## 2019-12-31 ENCOUNTER — Ambulatory Visit (INDEPENDENT_AMBULATORY_CARE_PROVIDER_SITE_OTHER): Payer: Commercial Managed Care - PPO | Admitting: *Deleted

## 2019-12-31 DIAGNOSIS — Z3200 Encounter for pregnancy test, result unknown: Secondary | ICD-10-CM

## 2019-12-31 DIAGNOSIS — R102 Pelvic and perineal pain: Secondary | ICD-10-CM | POA: Diagnosis not present

## 2019-12-31 DIAGNOSIS — Z3202 Encounter for pregnancy test, result negative: Secondary | ICD-10-CM | POA: Diagnosis not present

## 2019-12-31 DIAGNOSIS — R52 Pain, unspecified: Secondary | ICD-10-CM

## 2019-12-31 LAB — POCT PREGNANCY, URINE: Preg Test, Ur: NEGATIVE

## 2019-12-31 NOTE — Progress Notes (Signed)
Here for pregnancy test. States LMP 12/04/19  and is expecting it this weekend but feels off and wanted to get tested today.c/o feeling tired, intermittent sharp pelvic pain=8, spotting pink occasionally. Will do bhcg . Advised to schedule provider visit for first available for pelvic pain; but to go to the hospital if has severe pain.  Namrata Dangler,RN

## 2020-01-01 LAB — BETA HCG QUANT (REF LAB): hCG Quant: <1 m[IU]/mL

## 2020-01-01 NOTE — Progress Notes (Signed)
I have reviewed this chart and agree with the RN/CMA assessment and management.    K. Meryl Jemiah Ellenburg, M.D. Attending [facility] (Faculty Practice)   

## 2020-01-15 ENCOUNTER — Ambulatory Visit (INDEPENDENT_AMBULATORY_CARE_PROVIDER_SITE_OTHER): Payer: Commercial Managed Care - PPO | Admitting: Obstetrics & Gynecology

## 2020-01-15 ENCOUNTER — Encounter: Payer: Self-pay | Admitting: Obstetrics & Gynecology

## 2020-01-15 ENCOUNTER — Other Ambulatory Visit: Payer: Self-pay

## 2020-01-15 VITALS — BP 112/66 | HR 61 | Ht 67.0 in | Wt 168.0 lb

## 2020-01-15 DIAGNOSIS — D219 Benign neoplasm of connective and other soft tissue, unspecified: Secondary | ICD-10-CM

## 2020-01-15 MED ORDER — TRANEXAMIC ACID 650 MG PO TABS
1300.0000 mg | ORAL_TABLET | Freq: Three times a day (TID) | ORAL | 3 refills | Status: AC
Start: 2020-01-15 — End: 2020-01-20

## 2020-01-15 MED ORDER — PNV-SELECT 27-0.6-0.4 MG PO TABS: 1.0000 | ORAL_TABLET | Freq: Every day | ORAL | 6 refills | Status: AC

## 2020-01-15 NOTE — Progress Notes (Signed)
Subjective:     April Riddle is a 29 y.o. G2P0020 who presents for evaluation of abdominal pain. The pain is described as cramping, dull and pressure-like, and is 7/10 in intensity. Pain is located in the RLQ, LLQ and deep pelvis area without radiation. Onset was ongoing occurring 4 years ago. Symptoms have been gradually worsening since. Aggravating factors: bowel movement, movement and menses.  Alleviating factors: none. Associated symptoms: nausea and vomiting. The patient denies chills, constipation, diarrhea, dysuria and frequency. Risk factors for pelvic/abdominal pain include uterine fibroids.  Menstrual History: OB History    Gravida  1   Para  0   Term  0   Preterm  0   AB  0   Living  0     SAB  0   TAB  0   Ectopic  0   Multiple  0   Live Births  0           Menarche age: 57 Patient's last menstrual period was 01/05/2020 (exact date).    The following portions of the patient's history were reviewed and updated as appropriate: allergies, current medications, past family history, past medical history, past social history, past surgical history and problem list.   Review of Systems Pertinent items are noted in HPI. Pertinent items noted in HPI and remainder of comprehensive ROS otherwise negative.    Objective:    BP 112/66   Pulse 61   Ht 5\' 7"  (1.702 m)   Wt 76.2 kg   LMP 01/05/2020 (Exact Date)   Breastfeeding No   BMI 26.31 kg/m  General:   alert and cooperative  Lungs:   clear to auscultation bilaterally  Heart:   regular rate and rhythm, S1, S2 normal, no murmur, click, rub or gallop  Abdomen:  soft, non-tender; bowel sounds normal; no masses,  no organomegaly  CVA:   absent  Pelvis:  Exam deferred.  Extremities:   extremities normal, atraumatic, no cyanosis or edema  Neurologic:   negative  Psychiatric:   normal mood, behavior, speech, dress, and thought processes   Lab Review Labs: none   Imaging Ultrasound - Pelvic Vaginal   pending   Assessment:    Functional: dysmenorrhea and fibroids noted on prior CT. Will order TVUSG to better quantify fibroids.  Menorrhagia worsening as well. Pt desires futrure fertility.     Plan:   Discussed timed cloitus, LH kits for conception. Pt started on PNV, preconception counseling done.  Will start Lysteda for menorrhagia.   The diagnosis was discussed with the patient and evaluation and treatment plans outlined. See orders for lab and imaging studies. Follow up in 3 months or as needed.

## 2020-01-16 ENCOUNTER — Encounter: Payer: Self-pay | Admitting: Obstetrics & Gynecology

## 2020-01-27 ENCOUNTER — Other Ambulatory Visit: Payer: Self-pay

## 2020-01-27 ENCOUNTER — Ambulatory Visit
Admission: RE | Admit: 2020-01-27 | Discharge: 2020-01-27 | Disposition: A | Discharge status: Home / Self Care | Payer: Commercial Managed Care - PPO | Source: Ambulatory Visit | Attending: Obstetrics & Gynecology | Admitting: Obstetrics & Gynecology

## 2020-01-27 DIAGNOSIS — D219 Benign neoplasm of connective and other soft tissue, unspecified: Secondary | ICD-10-CM | POA: Diagnosis not present

## 2020-02-03 ENCOUNTER — Ambulatory Visit: Payer: Commercial Managed Care - PPO | Admitting: Obstetrics & Gynecology

## 2020-02-13 ENCOUNTER — Encounter: Payer: Self-pay | Admitting: Obstetrics & Gynecology

## 2020-02-13 ENCOUNTER — Other Ambulatory Visit: Payer: Self-pay

## 2020-02-13 ENCOUNTER — Ambulatory Visit (INDEPENDENT_AMBULATORY_CARE_PROVIDER_SITE_OTHER): Payer: Commercial Managed Care - PPO | Admitting: Obstetrics & Gynecology

## 2020-02-13 VITALS — BP 117/76 | HR 78 | Wt 169.1 lb

## 2020-02-13 DIAGNOSIS — D219 Benign neoplasm of connective and other soft tissue, unspecified: Secondary | ICD-10-CM

## 2020-02-13 DIAGNOSIS — D250 Submucous leiomyoma of uterus: Secondary | ICD-10-CM

## 2020-02-13 DIAGNOSIS — D259 Leiomyoma of uterus, unspecified: Secondary | ICD-10-CM | POA: Diagnosis not present

## 2020-02-13 NOTE — Progress Notes (Signed)
April Riddle is an 29 y.o. female. [id number] who presents for evaluation after U/S. U/S notes large fibroid  5.6cm pedunculated and multiple smaller submucosal fibroids.  Pt and partner have strong desire to conceive.    Menstrual History: Menarche age: 4 Patient's last menstrual period was 01/30/2020.    Past Medical History:  Diagnosis Date  . Anxiety   . Migraine   . Panic attack     No past surgical history on file.  No family history on file.  Social History:  reports that she has never smoked. She has never used smokeless tobacco. She reports current drug use. Drug: Marijuana. She reports that she does not drink alcohol.  Allergies:  Allergies  Allergen Reactions  . Amoxicillin Hives    (Not in a hospital admission)   Review of Systems  Constitutional: Negative for activity change, appetite change, fatigue and unexpected weight change.  HENT: Negative.   Eyes: Negative.   Respiratory: Negative.  Negative for chest tightness, shortness of breath and wheezing.   Cardiovascular: Negative.  Negative for chest pain and leg swelling.  Gastrointestinal: Negative.  Negative for abdominal distention, abdominal pain, constipation, diarrhea, nausea and vomiting.  Endocrine: Negative.   Genitourinary: Negative.  Negative for dysuria and hematuria.  Neurological: Negative.  Negative for dizziness, weakness, light-headedness, numbness and headaches.  Hematological: Negative.   Psychiatric/Behavioral: Negative.  Negative for agitation, confusion and decreased concentration.    Blood pressure 117/76, pulse 78, weight 169 lb 1.6 oz (76.7 kg), last menstrual period 01/30/2020. Physical Exam  Vitals reviewed. Constitutional: She is oriented to person, place, and time. She appears well-developed. She is uncooperative.  HENT:  Head: Normocephalic and atraumatic.  Eyes: Pupils are equal, round, and reactive to light.  Cardiovascular: Normal rate.  Respiratory: Effort normal.  GI:  Soft. Normal appearance. She exhibits no distension. There is no abdominal tenderness. There is no guarding.  Musculoskeletal:     Cervical back: Normal range of motion.  Neurological: She is alert and oriented to person, place, and time.  Skin: Skin is warm and dry.    No results found for this or any previous visit (from the past 24 hour(s)).  No results found.  Assessment/Plan: Patient had unprotected intercourse for about a year with no conception.  She reported normal menstrual periods.   Patient also advised about LH/ovulation predictor kits, also advised to have timed intercourse at least every other day around the time of ovulation.  Will check and follow up on infertility workup labs, next step will be to do semen analysis then HSG.  Will continue to follow.   Cherre Blanc 02/13/2020, 5:08 PM

## 2020-02-13 NOTE — Patient Instructions (Signed)
Female Infertility  Female infertility refers to a woman's inability to get pregnant (conceive) after a year of having sex regularly (or after 6 months in women over age 28) without using birth control. Infertility can also mean that a woman is not able to carry a pregnancy to full term. Both women and men can have fertility problems. What are the causes? This condition may be caused by:  Problems with reproductive organs. Infertility can result if a woman: ? Has an abnormally short cervix or a cervix that does not remain closed during a pregnancy. ? Has a blockage or scarring in the fallopian tubes. ? Has an abnormally shaped uterus. ? Has uterine fibroids. This is a benign mass of tissue or muscle (tumor) that can develop in the uterus. ? Is not ovulating in a regular way.  Certain medical conditions. These may include: ? Polycystic ovary syndrome (PCOS). This is a hormonal disorder that can cause small cysts to grow on the ovaries. This is the most common cause of infertility in women. ? Endometriosis. This is a condition in which the tissue that lines the uterus (endometrium) grows outside of its normal location. ? Cancer and cancer treatments, such as chemotherapy or radiation. ? Premature ovarian failure. This is when ovaries stop producing eggs and hormones before age 43. ? Sexually transmitted diseases, such as chlamydia or gonorrhea. ? Autoimmune disorders. These are disorders in which the body's defense system (immune system) attacks normal, healthy cells. Infertility can be linked to more than one cause. For some women, the cause of infertility is not known (unexplained infertility). What increases the risk?  Age. A woman's fertility declines with age, especially after her mid-62s.  Being underweight or overweight.  Drinking too much alcohol.  Using drugs such as anabolic steroids, cocaine, and marijuana.  Exercising excessively.  Being exposed to environmental toxins,  such as radiation, pesticides, and certain chemicals. What are the signs or symptoms? The main sign of infertility in women is the inability to get pregnant or carry a pregnancy to full term. How is this diagnosed? This condition may be diagnosed by:  Checking whether you are ovulating each month. The tests may include: ? Blood tests to check hormone levels. ? An ultrasound of the ovaries. ? Taking a small tissue that lines the uterus and checking it under a microscope (endometrial biopsy).  Doing additional tests. This is done if ovulation is normal. Tests may include: ? Hysterosalpingography. This X-ray test can show the shape of the uterus and whether the fallopian tubes are open. ? Laparoscopy. This test uses a lighted tube (laparoscope) to look for problems in the fallopian tubes and other organs. ? Transvaginal ultrasound. This imaging test is used to check for abnormalities in the uterus and ovaries. ? Hysteroscopy. This test uses a lighted tube to check for problems in the cervix and the uterus. To be diagnosed with infertility, both partners will have a physical exam. Both partners will also have an extensive medical and sexual history taken. Additional tests may be done. How is this treated? Treatment depends on the cause of infertility. Most cases of infertility in women are treated with medicine or surgery.  Women may take medicine to: ? Correct ovulation problems. ? Treat other health conditions.  Surgery may be done to: ? Repair damage to the ovaries, fallopian tubes, cervix, or uterus. ? Remove growths from the uterus. ? Remove scar tissue from the uterus, pelvis, or other organs. Assisted reproductive technology (ART) Assisted reproductive technology (  ART) refers to all treatments and procedures that combine eggs and sperm outside the body to try to help a couple conceive. ART is often combined with fertility drugs to stimulate ovulation. Sometimes ART is done using eggs  retrieved from another woman's body (donor eggs) or from previously frozen fertilized eggs (embryos). There are different types of ART. These include:  Intrauterine insemination (IUI). A long, thin tube is used to place sperm directly into a woman's uterus. This procedure: ? Is effective for infertility caused by sperm problems, including low sperm count and low motility. ? Can be used in combination with fertility drugs.  In vitro fertilization (IVF). This is done when a woman's fallopian tubes are blocked or when a man has low sperm count. In this procedure: ? Fertility drugs are used to stimulate the ovaries to produce multiple eggs. ? Once mature, these eggs are removed from the body and combined with the sperm to be fertilized. ? The fertilized eggs are then placed into the woman's uterus. Follow these instructions at home:  Take over-the-counter and prescription medicines only as told by your health care provider.  Do not use any products that contain nicotine or tobacco, such as cigarettes and e-cigarettes. If you need help quitting, ask your health care provider.  If you drink alcohol, limit how much you have to 1 drink a day.  Make dietary changes to lose weight or maintain a healthy weight. Work with your health care provider and a dietitian to set a weight-loss goal that is healthy and reasonable for you.  Seek support from a counselor or support group to talk about your concerns related to infertility. Couples counseling may be helpful for you and your partner.  Practice stress reduction techniques that work well for you, such as regular physical activity, meditation, or deep breathing.  Keep all follow-up visits as told by your health care provider. This is important. Contact a health care provider if you:  Feel that stress is interfering with your life and relationships.  Have side effects from treatments for infertility. Summary  Female infertility refers to a woman's  inability to get pregnant (conceive) after a year of having sex regularly (or after 6 months in women over age 35) without using birth control.  To be diagnosed with infertility, both partners will have a physical exam. Both partners will also have an extensive medical and sexual history taken.  Seek support from a counselor or support group to talk about your concerns related to infertility. Couples counseling may be helpful for you and your partner. This information is not intended to replace advice given to you by your health care provider. Make sure you discuss any questions you have with your health care provider. Document Revised: 11/29/2018 Document Reviewed: 07/10/2017 [employer] Patient Education  2020 [employer]  

## 2022-05-29 IMAGING — US US PELVIS COMPLETE WITH TRANSVAGINAL
1 series · 15 of 25 positions shown · non-contrast
Comparison: None

CLINICAL DATA: Menorrhagia, known uterine fibroids at, LMP
01/05/2020

EXAM:
TRANSABDOMINAL AND TRANSVAGINAL ULTRASOUND OF PELVIS
TECHNIQUE: Both transabdominal and transvaginal ultrasound examinations of the
pelvis were performed. Transabdominal technique was performed for
global imaging of the pelvis including uterus, ovaries, adnexal
regions, and pelvic cul-de-sac. It was necessary to proceed with
endovaginal exam following the transabdominal exam to visualize the
endometrium and ovaries.

[Series 1: us pelvis complete with transvaginal · 15 of 52 slices shown]
[im 1/52]
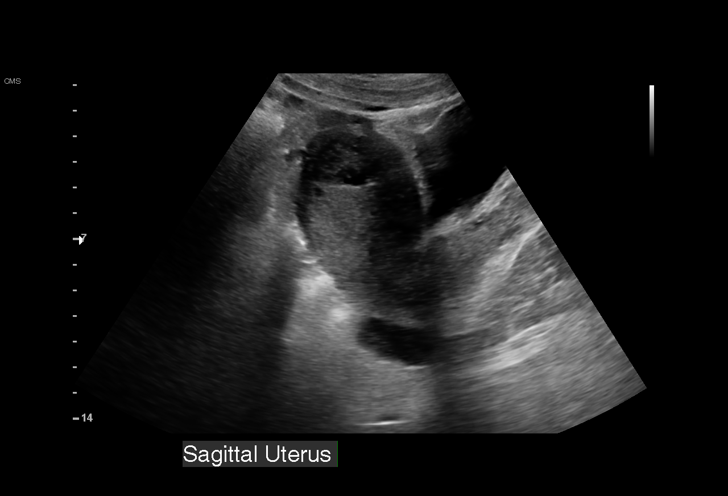
[im 5/52]
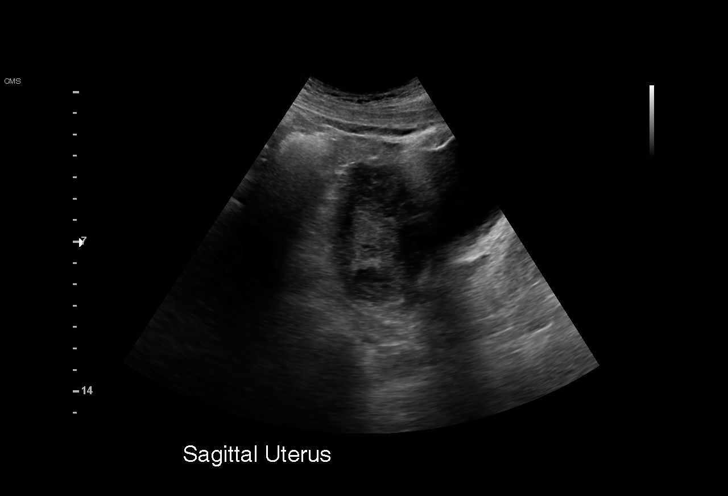
[im 9/52]
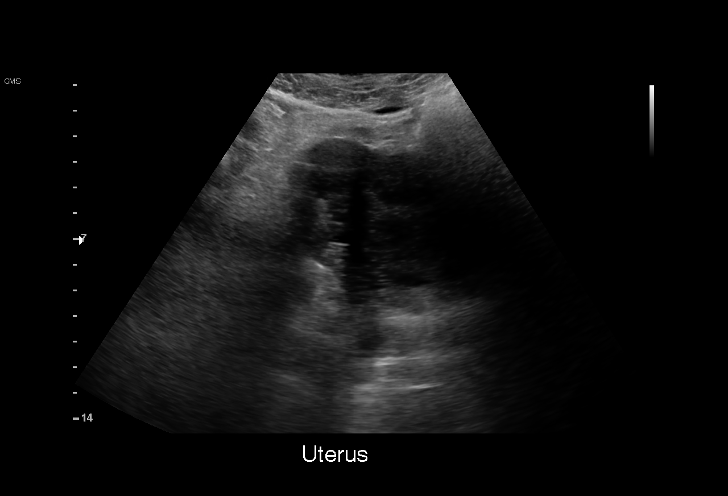
[im 11/52]
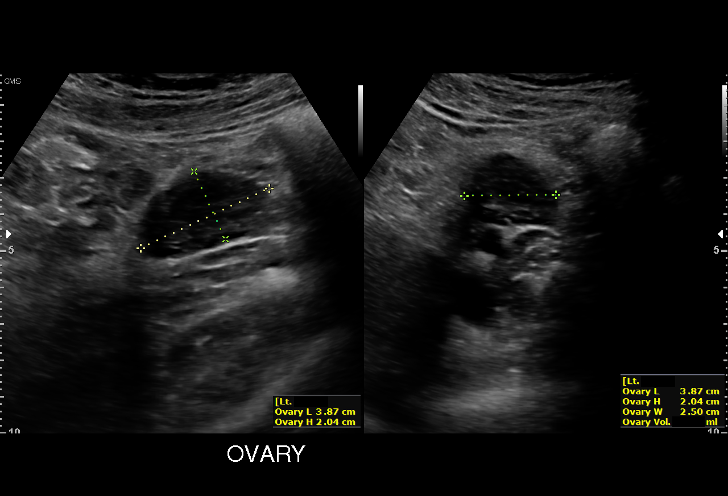
[im 15/52]
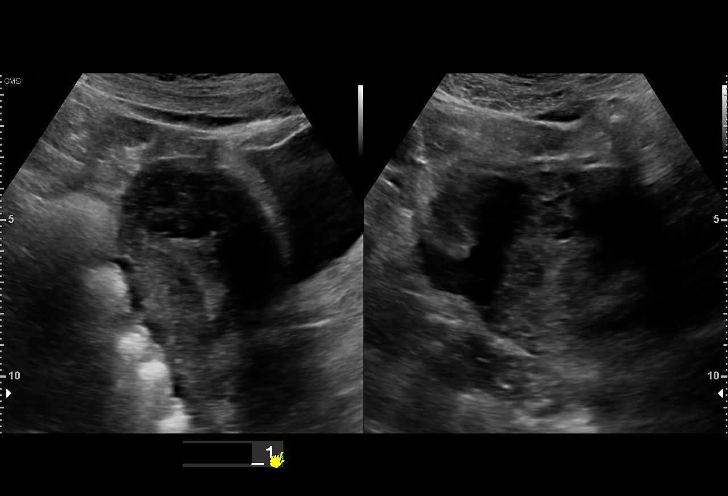
[im 20/52]
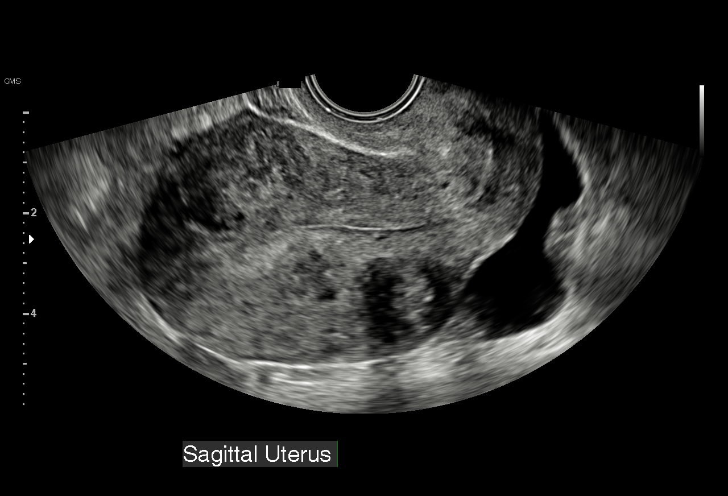
[im 22/52]
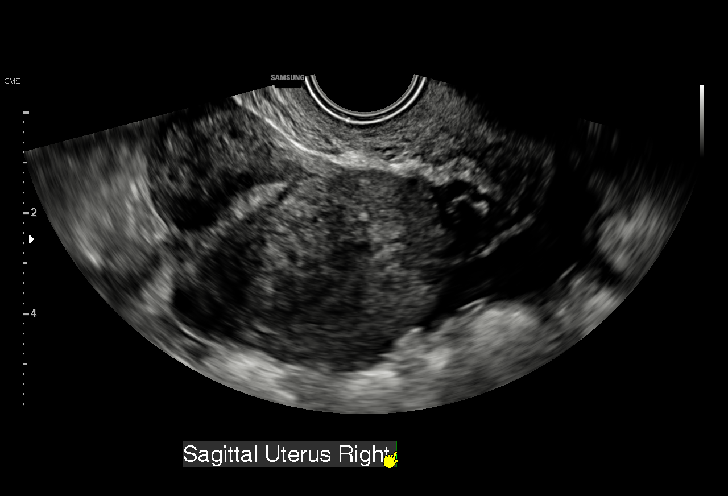
[im 26/52]
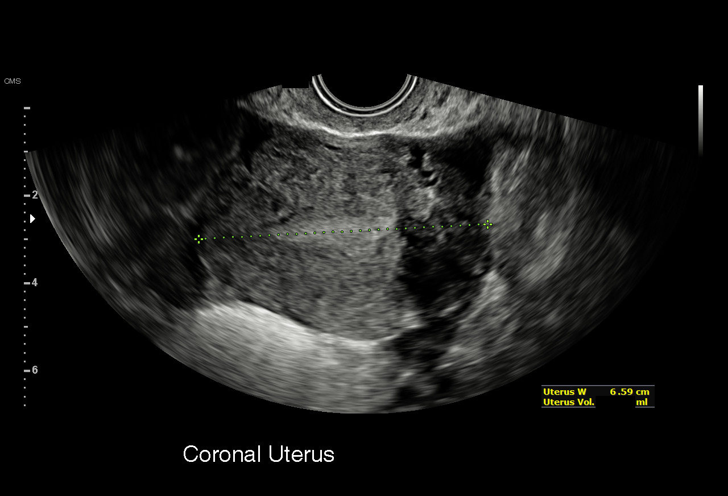
[im 30/52]
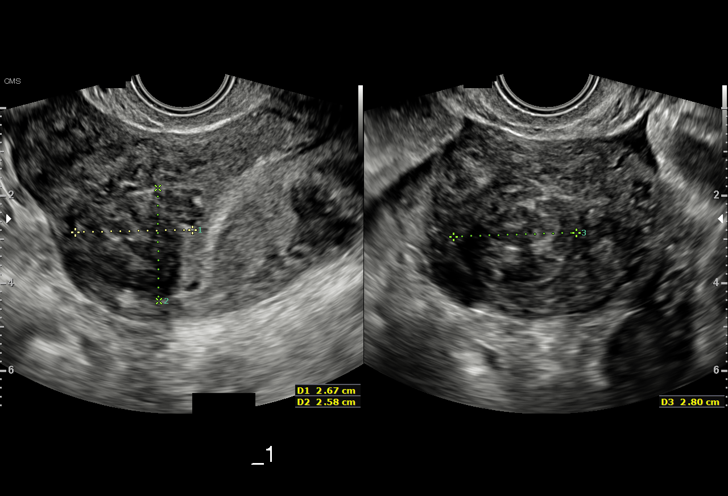
[im 32/52]
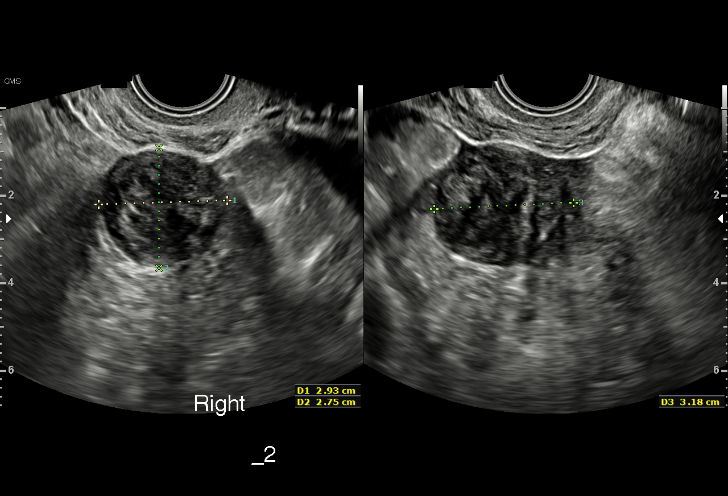
[im 37/52]
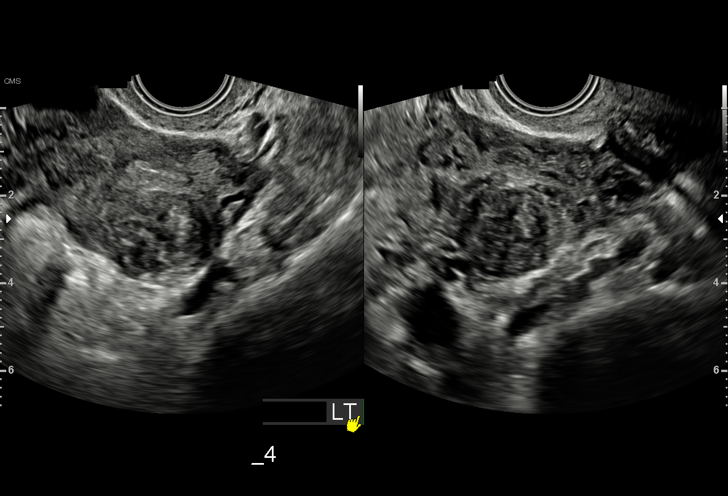
[im 41/52]
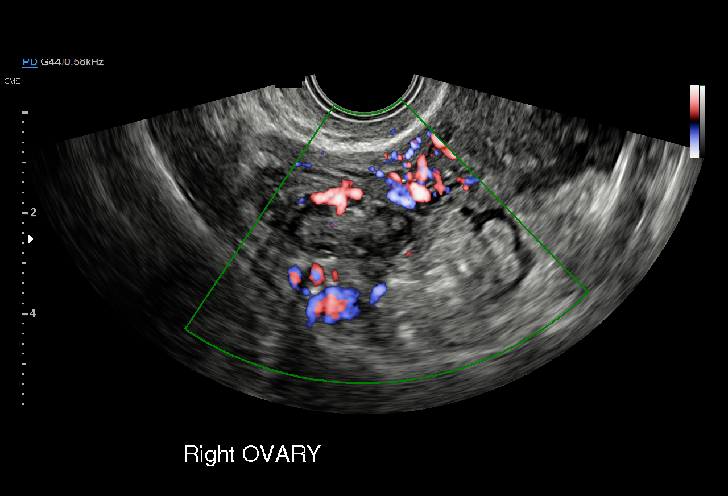
[im 43/52]
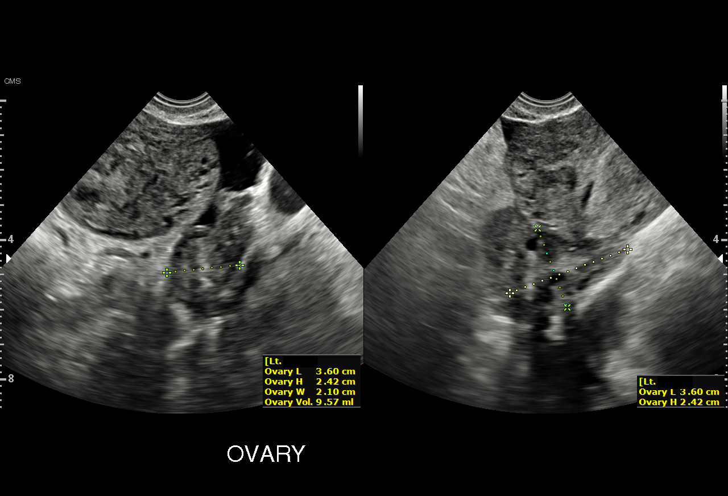
[im 47/52]
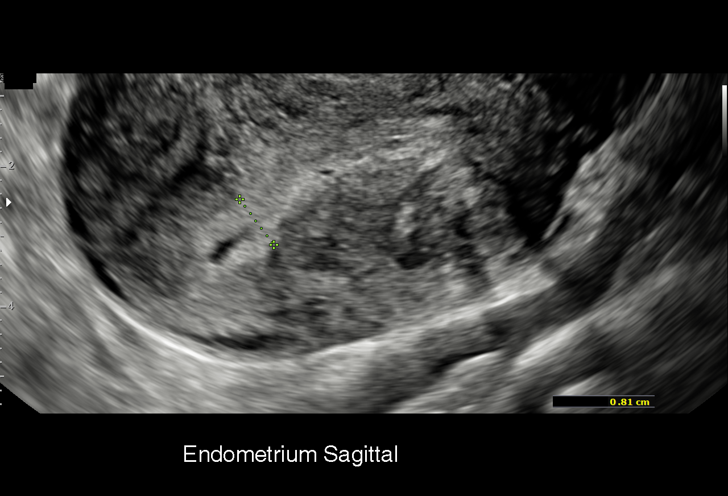
[im 52/52]
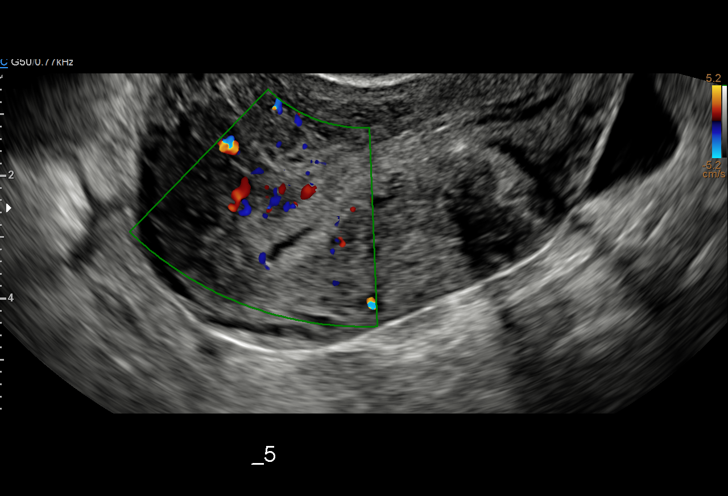

[15 of 25 positions shown; findings below may reference images not displayed]

FINDINGS: Uterus

Measurements: 6.6 x 5.0 x 6.6 cm = volume: 114 mL. Anteverted.
Heterogeneous myometrium. Exophytic leiomyoma RIGHT fundus 3.2 x
x 2.8 cm. Submucosal leiomyoma anterior upper uterus 2.8 x 2.7 x
cm. Posterior subserosal leiomyomata mid uterus 2.8 x 2.6 x 2.3 cm
and posterior to LEFT 2.7 x 2.5 x 2.2 cm. Additional small
submucosal leiomyoma 12 x 10 x 9 mm at upper uterus.

Endometrium

Thickness: 8 mm. Tiny amount of nonspecific endometrial fluid at
fundal portion of endometrial canal. No focal mass.

Right ovary

Measurements: 3.6 x 1.5 x 2.4 cm = volume: 7 mL. Normal morphology
without mass

Left ovary

Measurements: 3.9 x 2.0 x 2.5 cm = volume: 10.4 mL. Normal
morphology without mass

Other findings

Small amount of nonspecific free pelvic fluid.  No adnexal masses.
IMPRESSION: Multiple uterine leiomyomata, several of which are submucosal.

Unremarkable ovaries and adnexa.
